# Patient Record
Sex: Female | Born: 1970 | Race: White | Hispanic: No | Marital: Married | Smoking: Former smoker
Health system: Southern US, Community
[De-identification: ages and names within clinical notes are randomized; demographics above are authoritative.]

## PROBLEM LIST (undated history)

## (undated) DIAGNOSIS — E669 Obesity, unspecified: Secondary | ICD-10-CM

## (undated) DIAGNOSIS — R739 Hyperglycemia, unspecified: Secondary | ICD-10-CM

## (undated) DIAGNOSIS — T8859XA Other complications of anesthesia, initial encounter: Secondary | ICD-10-CM

## (undated) DIAGNOSIS — T4145XA Adverse effect of unspecified anesthetic, initial encounter: Secondary | ICD-10-CM

## (undated) DIAGNOSIS — K649 Unspecified hemorrhoids: Secondary | ICD-10-CM

## (undated) DIAGNOSIS — E119 Type 2 diabetes mellitus without complications: Secondary | ICD-10-CM

## (undated) DIAGNOSIS — N951 Menopausal and female climacteric states: Secondary | ICD-10-CM

## (undated) DIAGNOSIS — C4490 Unspecified malignant neoplasm of skin, unspecified: Secondary | ICD-10-CM

## (undated) DIAGNOSIS — L0292 Furuncle, unspecified: Secondary | ICD-10-CM

## (undated) DIAGNOSIS — R232 Flushing: Secondary | ICD-10-CM

## (undated) DIAGNOSIS — F419 Anxiety disorder, unspecified: Secondary | ICD-10-CM

## (undated) DIAGNOSIS — E785 Hyperlipidemia, unspecified: Principal | ICD-10-CM

## (undated) HISTORY — PX: ANKLE FRACTURE SURGERY: SHX122

## (undated) HISTORY — PX: COLONOSCOPY: SHX174

## (undated) HISTORY — DX: Hyperglycemia, unspecified: R73.9

## (undated) HISTORY — DX: Furuncle, unspecified: L02.92

## (undated) HISTORY — DX: Flushing: R23.2

## (undated) HISTORY — DX: Unspecified hemorrhoids: K64.9

## (undated) HISTORY — DX: Unspecified malignant neoplasm of skin, unspecified: C44.90

## (undated) HISTORY — DX: Hyperlipidemia, unspecified: E78.5

## (undated) HISTORY — PX: OTHER SURGICAL HISTORY: SHX169

## (undated) HISTORY — DX: Menopausal and female climacteric states: N95.1

## (undated) HISTORY — DX: Anxiety disorder, unspecified: F41.9

## (undated) HISTORY — DX: Obesity, unspecified: E66.9

## (undated) HISTORY — DX: Type 2 diabetes mellitus without complications: E11.9

---

## 2008-05-17 ENCOUNTER — Other Ambulatory Visit: Admission: RE | Admit: 2008-05-17 | Discharge: 2008-05-17 | Payer: Self-pay | Admitting: Obstetrics and Gynecology

## 2009-08-13 ENCOUNTER — Other Ambulatory Visit: Admission: RE | Admit: 2009-08-13 | Discharge: 2009-08-13 | Payer: Self-pay | Admitting: Obstetrics and Gynecology

## 2010-10-13 HISTORY — PX: ENDOMETRIAL ABLATION: SHX621

## 2010-10-30 ENCOUNTER — Other Ambulatory Visit: Payer: Self-pay | Admitting: Adult Health

## 2010-10-30 ENCOUNTER — Other Ambulatory Visit
Admission: RE | Admit: 2010-10-30 | Discharge: 2010-10-30 | Payer: Self-pay | Source: Home / Self Care | Admitting: Obstetrics and Gynecology

## 2011-11-03 ENCOUNTER — Other Ambulatory Visit (HOSPITAL_COMMUNITY)
Admission: RE | Admit: 2011-11-03 | Discharge: 2011-11-03 | Disposition: A | Discharge status: Home / Self Care | Payer: BC Managed Care – PPO | Source: Ambulatory Visit | Attending: Obstetrics and Gynecology | Admitting: Obstetrics and Gynecology

## 2011-11-03 ENCOUNTER — Other Ambulatory Visit: Payer: Self-pay | Admitting: Adult Health

## 2011-11-03 DIAGNOSIS — Z01419 Encounter for gynecological examination (general) (routine) without abnormal findings: Secondary | ICD-10-CM | POA: Insufficient documentation

## 2012-03-08 ENCOUNTER — Encounter (HOSPITAL_COMMUNITY): Payer: Self-pay | Admitting: *Deleted

## 2012-03-08 ENCOUNTER — Emergency Department (HOSPITAL_COMMUNITY): Payer: BC Managed Care – PPO

## 2012-03-08 ENCOUNTER — Emergency Department (HOSPITAL_COMMUNITY)
Admission: EM | Admit: 2012-03-08 | Discharge: 2012-03-08 | Disposition: A | Discharge status: Home / Self Care | Payer: BC Managed Care – PPO | Attending: Emergency Medicine | Admitting: Emergency Medicine

## 2012-03-08 DIAGNOSIS — W010XXA Fall on same level from slipping, tripping and stumbling without subsequent striking against object, initial encounter: Secondary | ICD-10-CM | POA: Insufficient documentation

## 2012-03-08 DIAGNOSIS — S82842A Displaced bimalleolar fracture of left lower leg, initial encounter for closed fracture: Secondary | ICD-10-CM

## 2012-03-08 DIAGNOSIS — S82843A Displaced bimalleolar fracture of unspecified lower leg, initial encounter for closed fracture: Secondary | ICD-10-CM | POA: Insufficient documentation

## 2012-03-08 DIAGNOSIS — Y92009 Unspecified place in unspecified non-institutional (private) residence as the place of occurrence of the external cause: Secondary | ICD-10-CM | POA: Insufficient documentation

## 2012-03-08 MED ORDER — HYDROMORPHONE HCL PF 2 MG/ML IJ SOLN
2.0000 mg | Freq: Once | INTRAMUSCULAR | Status: AC
  Administered 2012-03-08: 2 mg via INTRAMUSCULAR
  Filled 2012-03-08: qty 1

## 2012-03-08 MED ORDER — OXYCODONE-ACETAMINOPHEN 5-325 MG PO TABS: 2.0000 | ORAL_TABLET | ORAL | Status: DC | PRN

## 2012-03-08 MED ORDER — ONDANSETRON HCL 4 MG/2ML IJ SOLN
4.0000 mg | Freq: Once | INTRAMUSCULAR | Status: AC
  Administered 2012-03-08: 4 mg via INTRAMUSCULAR
  Filled 2012-03-08: qty 2

## 2012-03-08 NOTE — ED Provider Notes (Signed)
History     CSN: [id number]  Arrival date & time 03/08/12  2037   First MD Initiated Contact with Patient 03/08/12 2126      Chief Complaint  Patient presents with  . Fall    (Consider location/radiation/quality/duration/timing/severity/associated sxs/prior treatment) HPI Comments: Pt was carrying a bag of trash outside.  She was wearing crocs and slipped on wet stairs and twisted L ankle.  No other significant injury.  The history is provided by the patient. No language interpreter was used.    History reviewed. No pertinent past medical history.  Past Surgical History  Procedure Date  . Cesarean section     No family history on file.  History  Substance Use Topics  . Smoking status: Current Everyday Smoker  . Smokeless tobacco: Not on file  . Alcohol Use: Yes    OB History    Grav Para Term Preterm Abortions TAB SAB Ect Mult Living                  Review of Systems  Musculoskeletal:       Ankle injury   Neurological:       Mild tingling in foot.  All other systems reviewed and are negative.    Allergies  Review of patient's allergies indicates no known allergies.  Home Medications   Current Outpatient Rx  Name Route Sig Dispense Refill  . VITAMIN D 1000 UNITS PO TABS Oral Take 1,000 Units by mouth daily.    Marland Kitchen CRANBERRY 450 MG PO TABS Oral Take 1 tablet by mouth daily.    . OMEGA-3 FATTY ACIDS 1000 MG PO CAPS Oral Take 1 g by mouth daily.    . OXYCODONE-ACETAMINOPHEN 5-325 MG PO TABS Oral Take 2 tablets by mouth every 4 (four) hours as needed for pain. 6 tablet 0    BP 122/76  Pulse 106  Temp(Src) 98 F (36.7 C) (Oral)  Resp 20  Ht 5\' 5"  (1.651 m)  Wt 195 lb (88.451 kg)  BMI 32.45 kg/m2  SpO2 99%  Physical Exam  Nursing note and vitals reviewed. Constitutional: She is oriented to person, place, and time. She appears well-developed and well-nourished. No distress.  HENT:  Head: Normocephalic and atraumatic.  Eyes: EOM are normal.    Neck: Normal range of motion.  Cardiovascular: Normal rate, regular rhythm, normal heart sounds and intact distal pulses.   Pulmonary/Chest: Effort normal and breath sounds normal.  Abdominal: Soft. She exhibits no distension. There is no tenderness.  Musculoskeletal: She exhibits tenderness.       Left ankle: She exhibits decreased range of motion, swelling and ecchymosis. She exhibits no deformity, no laceration and normal pulse. tenderness. Lateral malleolus and medial malleolus tenderness found. No proximal fibula tenderness found.       Feet:  Neurological: She is alert and oriented to person, place, and time.  Skin: Skin is warm and dry.  Psychiatric: She has a normal mood and affect. Judgment normal.    ED Course  Procedures (including critical care time)  Labs Reviewed - No data to display Dg Ankle Complete Left  03/08/2012  *RADIOLOGY REPORT*  Clinical Data: Pain after falling.  Popping of the ankle.  LEFT ANKLE COMPLETE - 3+ VIEW  Comparison: None.  Findings: There is significant soft tissue swelling of the ankle diffusely.  There is a displaced fracture of the medial malleolus, and a comminuted fracture the lateral malleolus.  It is difficult to exclude a posterior malleolar fracture. Suspect disruption  of the interosseous membrane.  IMPRESSION:  1.  Fractures of the medial and lateral malleoli. 2.  Question of posterior malleolus fracture. 3.  Suspect interosseous membrane disruption. 4.  soft tissue swelling.  Original Report Authenticated By: Patterson Hammersmith, M.D.     1. Closed bimalleolar fracture of left ankle       MDM  2100-discussed with dr. Romeo Apple.  Recommends posterior splint and crutches.  Non-weight bearing.  Pain medicine, ice and elevation.  Will see in his office tomorrow at 0900. rx-percocet 5/325, 20 ibprofen 800 mg every 8 hrs with food.        Worthy Rancher, PA 03/08/12 2220

## 2012-03-08 NOTE — ED Notes (Signed)
Fell on wet steps, app 6 steps, Injury to lt ankle, No LOC, alert, No HI or neck pain.

## 2012-03-08 NOTE — ED Notes (Signed)
Pt presents with left ankle pain secondary to a fall down 6 deck stairs at her home. Left ankle has noted deformity, swelling and bruising noted. Pt also hurt rt forearm,with noted bruising and a superficial abrasion.  Pt denies loc. Pt is unable to bear weight on lt foot. Dr Romeo Apple consulted per EDP.

## 2012-03-08 NOTE — Discharge Instructions (Signed)
Bimalleolar Fracture, Ankle, Adult, Displaced (ORIF) A bimalleolar fracture (break in bone) is two fractures in the lower bones of your leg that help to make up your ankle. These fractures are in the bone you feel as the bump on the outside of your ankle (fibula) and the bone that you feel as the bump on the inside of your ankle (tibia). Your fractures are displaced. This means the bones are not in their normal position and will not give a good result if they heal in that position. Because of this, surgery is required. This is called an open reduction and internal fixation (ORIF). Even with the best of care and perfect results this ankle may be more prone to be arthritis later due to damage of the cartilage lining the ankle joint which is not visible on X-ray. These fractures are easily diagnosed with X-rays. TREATMENT  You have fractures that would probably heal with disability, without surgery. Open reduction means that the area of the fracture is opened up to the vision of the surgeon and internal fixation means that a screw, pins or fixation device is used to hold the boney pieces in place. Following surgery a short-leg cast or removable fracture boot is then applied from your toes to below your knee. This is generally left in place for about 5 to 6 weeks, during which time it is followed by your caregiver and X-rays may be taken to make sure the bones stay in place. RISKS & COMPLICATIONS: All surgery is associated with risks. Some of these risks are:  Excessive bleeding   Infection   Failure to heal properly resulting in an unstable or arthritic ankle   Stiffness of ankle following repair  LET YOUR CAREGIVERS KNOW ABOUT:  Allergies.   Medications taken including herbs, eye drops, over-the-counter medications, and creams.   Use of steroids (by mouth or creams).   History of bleeding or blood problems.   Previous problems with anesthetics or numbing medication, including a family history  of these problems.   Possibility of pregnancy, if this applies.   History of blood clots (thrombophlebitis).   Previous surgery.   Other health problems.  BEFORE AND AFTER YOUR SURGERY Prior to surgery an IV (intravenous line connected to your vein for giving fluids) may be started and you will be given an anesthetic (medications and gas to make you sleep). You may also be given a regional anesthetic such as a spinal or epidural block. After surgery, you will be taken to the recovery area where a nurse will monitor your progress. You may have a catheter (a long, narrow, hollow tube) in your bladder following surgery that helps you pass your water. When you are awake, are stable, taking fluids well and without complications, you will be returned to your room. You will receive physical therapy and other care until you are doing well and your caregiver feels it is safe for you to be transferred either to home or to an extended care facility. HOME CARE INSTRUCTIONS   You may resume normal diet and activities as directed or allowed.   Do not drive a vehicle until your caregiver specifically tells you it is safe to do so.   Keep ice packs (a bag of ice wrapped in a towel) on the surgical area for 20 minutes, 4 times per day, for the first two days following surgery. Use the ice only if okay with your surgeon or caregiver.   Elevate your ankle above your heart as much  as possible for the first 24 to 48 hours after the operation.   Change dressings if necessary or as directed.   If you have a plaster or fiberglass cast:   Do not try to scratch the skin under the cast using sharp or pointed objects.   Check the skin around the cast every day. You may put lotion on any red or sore areas.   Keep your cast dry and clean.   Do not put pressure on any part of your cast or splint until it is fully hardened.   Your cast or splint can be protected during bathing with a plastic bag. Do not lower the  cast or splint into water.   Only take over-the-counter or prescription medicines for pain, discomfort, or fever as directed by your caregiver.   Use crutches as directed and do not exercise leg unless instructed.   These are not fractures to be taken lightly! If these bones become displaced and get out of position, it may eventually lead to arthritis and disability for the rest of your life. Problems often follow even the best of care. Follow the directions of your caregiver.   Keep appointments as directed.  SEEK IMMEDIATE MEDICAL CARE IF:   Redness, swelling, numbness, or increasing pain in the wound.   Pus coming from wound.   An unexplained oral temperature above 102 F (38.9 C) or as your caregiver suggests.   A bad smell coming from the wound or dressing.   A breaking open of the wound (edges not staying together) after sutures or staples have been removed.   Your skin or nails below the injury turn blue or gray, or feel cold or numb.   You develop severe pain under the cast or in your foot. Especially when someone else moves your toes.  Follow all instructions given to you by your caregiver, make and keep follow up appointments, and use crutches as directed. If you do not have a window in your cast for observing the wound, a discharge, or minor bleeding may show up as a stain on the outside of your dressings, your cast, or plaster splint. Report these findings to your caregiver. MAKE SURE YOU:   Understand these instructions.   Will watch your condition.   Will get help right away if you are not doing well or get worse.  Document Released: 07/09/2005 Document Revised: 09/18/2011 Document Reviewed: 05/03/2008 [facility] Patient Information 2012 [place], [place].   Take the percocet every 4-6 hrs as needed for pain.  Apply ice 20-30 min several times daily and elevate as much as possible.  Dr. Romeo Apple will see you in his office tomorrow at 0900.  Use the crutches and do not  bear weight.  Have dr. Romeo Apple write you a prescription for the pain medicine of his choice.

## 2012-03-08 NOTE — ED Provider Notes (Signed)
Medical screening examination/treatment/procedure(s) were performed by non-physician practitioner and as supervising physician I was immediately available for consultation/collaboration. Devoria Albe, MD, Armando Gang   Ward Givens, MD 03/08/12 2251

## 2012-03-09 ENCOUNTER — Encounter: Payer: Self-pay | Admitting: Orthopedic Surgery

## 2012-03-09 ENCOUNTER — Encounter (HOSPITAL_COMMUNITY)
Admission: RE | Admit: 2012-03-09 | Discharge: 2012-03-09 | Disposition: A | Discharge status: Home / Self Care | Payer: BC Managed Care – PPO | Source: Ambulatory Visit | Attending: Orthopedic Surgery | Admitting: Orthopedic Surgery

## 2012-03-09 ENCOUNTER — Encounter (HOSPITAL_COMMUNITY): Payer: Self-pay | Admitting: Pharmacy Technician

## 2012-03-09 ENCOUNTER — Ambulatory Visit (INDEPENDENT_AMBULATORY_CARE_PROVIDER_SITE_OTHER): Payer: BC Managed Care – PPO | Admitting: Orthopedic Surgery

## 2012-03-09 ENCOUNTER — Encounter (HOSPITAL_COMMUNITY): Payer: Self-pay

## 2012-03-09 VITALS — BP 100/60 | Ht 65.0 in | Wt 195.0 lb

## 2012-03-09 DIAGNOSIS — S82843A Displaced bimalleolar fracture of unspecified lower leg, initial encounter for closed fracture: Secondary | ICD-10-CM

## 2012-03-09 LAB — HEMOGLOBIN AND HEMATOCRIT, BLOOD: Hemoglobin: 13.9 g/dL (ref 12.0–15.0)

## 2012-03-09 LAB — SURGICAL PCR SCREEN: Staphylococcus aureus: NEGATIVE

## 2012-03-09 LAB — HCG, SERUM, QUALITATIVE: Preg, Serum: NEGATIVE

## 2012-03-09 MED ORDER — OXYCODONE-ACETAMINOPHEN 5-325 MG PO TABS: 1.0000 | ORAL_TABLET | ORAL | Status: AC | PRN

## 2012-03-09 MED ORDER — PROMETHAZINE HCL 12.5 MG PO TABS
12.5000 mg | ORAL_TABLET | Freq: Four times a day (QID) | ORAL | Status: DC | PRN
Start: 2012-03-09 — End: 2012-03-09

## 2012-03-09 NOTE — Patient Instructions (Addendum)
PATIENT INSTRUCTIONS POST-ANESTHESIA  IMMEDIATELY FOLLOWING SURGERY:  Do not drive or operate machinery for the first twenty four hours after surgery.  Do not make any important decisions for twenty four hours after surgery or while taking narcotic pain medications or sedatives.  If you develop intractable nausea and vomiting or a severe headache please notify your doctor immediately.  FOLLOW-UP:  Please make an appointment with your surgeon as instructed. You do not need to follow up with anesthesia unless specifically instructed to do so.  WOUND CARE INSTRUCTIONS (if applicable):  Keep a dry clean dressing on the anesthesia/puncture wound site if there is drainage.  Once the wound has quit draining you may leave it open to air.  Generally you should leave the bandage intact for twenty four hours unless there is drainage.  If the epidural site drains for more than 36-48 hours please call the anesthesia department.  QUESTIONS?:  Please feel free to call your physician or the hospital operator if you have any questions, and they will be happy to assist you.    20 Margaret [name]  03/09/2012   Your procedure is scheduled on:  03/11/2012  Report to [facility] at  1130  AM.  Call this number if you have problems the morning of surgery: [phone]   Remember:   Do not eat food:After Midnight.  May have clear liquids:until Midnight .    Take these medicines the morning of surgery with A SIP OF WATER:  Percocet,phenergan   Do not wear jewelry, make-up or nail polish.  Do not wear lotions, powders, or perfumes. You may wear deodorant.  Do not shave 48 hours prior to surgery. Men may shave face and neck.  Do not bring valuables to the hospital.  Contacts, dentures or bridgework may not be worn into surgery.  Leave suitcase in the car. After surgery it may be brought to your room.  For patients admitted to the hospital, checkout time is 11:00 AM the day of discharge.   Patients discharged the day of  surgery will not be allowed to drive home.  Name and phone number of your driver: family  Special Instructions: CHG Shower Use Special Wash: 1/2 bottle night before surgery and 1/2 bottle morning of surgery.   Please read over the following fact sheets that you were given: Pain Booklet, MRSA Information, Surgical Site Infection Prevention, Anesthesia Post-op Instructions and Care and Recovery After Surgery Ankle Fracture A fracture is a break in the bone. A cast or splint is used to protect and keep your injured bone from moving.  HOME CARE INSTRUCTIONS   Use your crutches as directed.   To lessen the swelling, keep the injured leg elevated while sitting or lying down.   Apply ice to the injury for 15 to 20 minutes, 3 to 4 times per day while awake for 2 days. Put the ice in a plastic bag and place a thin towel between the bag of ice and your cast.   If you have a plaster or fiberglass cast:   Do not try to scratch the skin under the cast using sharp or pointed objects.   Check the skin around the cast every day. You may put lotion on any red or sore areas.   Keep your cast dry and clean.   If you have a plaster splint:   Wear the splint as directed.   You may loosen the elastic around the splint if your toes become numb, tingle, or turn  cold or blue.   Do not put pressure on any part of your cast or splint; it may break. Rest your cast only on a pillow the first 24 hours until it is fully hardened.   Your cast or splint can be protected during bathing with a plastic bag. Do not lower the cast or splint into water.   Take medications as directed by your caregiver. Only take over-the-counter or prescription medicines for pain, discomfort, or fever as directed by your caregiver.   Do not drive a vehicle until your caregiver specifically tells you it is safe to do so.   If your caregiver has given you a follow-up appointment, it is very important to keep that appointment. Not keeping  the appointment could result in a chronic or permanent injury, pain, and disability. If there is any problem keeping the appointment, you must call back to this facility for assistance.  SEEK IMMEDIATE MEDICAL CARE IF:   Your cast gets damaged or breaks.   You have continued severe pain or more swelling than you did before the cast was put on.   Your skin or toenails below the injury turn blue or gray, or feel cold or numb.   There is a bad smell or new stains and/or purulent (pus like) drainage coming from under the cast.  If you do not have a window in your cast for observing the wound, a discharge or minor bleeding may show up as a stain on the outside of your cast. Report these findings to your caregiver. MAKE SURE YOU:   Understand these instructions.   Will watch your condition.   Will get help right away if you are not doing well or get worse.  Document Released: 09/26/2000 Document Revised: 09/18/2011 Document Reviewed: 05/02/2008 [facility] Patient Information 2012 [place], [place].

## 2012-03-09 NOTE — Patient Instructions (Addendum)
ANKLE SURGERY  You have been scheduled for surgery.  All surgeries carry some risk.  Remember you always have the option of continued nonsurgical treatment. However in this situation the risks vs. the benefits favor surgery as the best treatment option. The risks of the surgery includes the following but is not limited to bleeding, infection, pulmonary embolus, death from anesthesia, nerve injury vascular injury or need for further surgery, continued pain.  Specific to this procedure the following risks and complications are rare but possible Stiffness, pain, INFECTION   I expect 3-6 months to fully recover from this procedure.    Bimalleolar Fracture, Ankle, Adult, Displaced (ORIF) A bimalleolar fracture (break in bone) is two fractures in the lower bones of your leg that help to make up your ankle. These fractures are in the bone you feel as the bump on the outside of your ankle (fibula) and the bone that you feel as the bump on the inside of your ankle (tibia). Your fractures are displaced. This means the bones are not in their normal position and will not give a good result if they heal in that position. Because of this, surgery is required. This is called an open reduction and internal fixation (ORIF). Even with the best of care and perfect results this ankle may be more prone to be arthritis later due to damage of the cartilage lining the ankle joint which is not visible on X-ray. These fractures are easily diagnosed with X-rays. TREATMENT   You have fractures that would probably heal with disability, without surgery. Open reduction means that the area of the fracture is opened up to the vision of the surgeon and internal fixation means that a screw, pins or fixation device is used to hold the boney pieces in place. Following surgery a short-leg cast or removable fracture boot is then applied from your toes to below your knee. This is generally left in place for about 5 to 6 weeks, during which  time it is followed by your caregiver and X-rays may be taken to make sure the bones stay in place. RISKS & COMPLICATIONS: All surgery is associated with risks. Some of these risks are:  Excessive bleeding   Infection   Failure to heal properly resulting in an unstable or arthritic ankle   Stiffness of ankle following repair  LET YOUR CAREGIVERS KNOW ABOUT:  Allergies.   Medications taken including herbs, eye drops, over-the-counter medications, and creams.   Use of steroids (by mouth or creams).   History of bleeding or blood problems.   Previous problems with anesthetics or numbing medication, including a family history of these problems.   Possibility of pregnancy, if this applies.   History of blood clots (thrombophlebitis).   Previous surgery.   Other health problems.  BEFORE AND AFTER YOUR SURGERY Prior to surgery an IV (intravenous line connected to your vein for giving fluids) may be started and you will be given an anesthetic (medications and gas to make you sleep). You may also be given a regional anesthetic such as a spinal or epidural block. After surgery, you will be taken to the recovery area where a nurse will monitor your progress. You may have a catheter (a long, narrow, hollow tube) in your bladder following surgery that helps you pass your water. When you are awake, are stable, taking fluids well and without complications, you will be returned to your room. You will receive physical therapy and other care until you are doing well and your  caregiver feels it is safe for you to be transferred either to home or to an extended care facility. HOME CARE INSTRUCTIONS    You may resume normal diet and activities as directed or allowed.   Do not drive a vehicle until your caregiver specifically tells you it is safe to do so.   Keep ice packs (a bag of ice wrapped in a towel) on the surgical area for 20 minutes, 4 times per day, for the first two days following surgery.  Use the ice only if okay with your surgeon or caregiver.   Elevate your ankle above your heart as much as possible for the first 24 to 48 hours after the operation.   Change dressings if necessary or as directed.   If you have a plaster or fiberglass cast:   Do not try to scratch the skin under the cast using sharp or pointed objects.   Check the skin around the cast every day. You may put lotion on any red or sore areas.   Keep your cast dry and clean.   Do not put pressure on any part of your cast or splint until it is fully hardened.   Your cast or splint can be protected during bathing with a plastic bag. Do not lower the cast or splint into water.   Only take over-the-counter or prescription medicines for pain, discomfort, or fever as directed by your caregiver.   Use crutches as directed and do not exercise leg unless instructed.   These are not fractures to be taken lightly! If these bones become displaced and get out of position, it may eventually lead to arthritis and disability for the rest of your life. Problems often follow even the best of care. Follow the directions of your caregiver.   Keep appointments as directed.  SEEK IMMEDIATE MEDICAL CARE IF:    Redness, swelling, numbness, or increasing pain in the wound.   Pus coming from wound.   An unexplained oral temperature above 102 F (38.9 C) or as your caregiver suggests.   A bad smell coming from the wound or dressing.   A breaking open of the wound (edges not staying together) after sutures or staples have been removed.   Your skin or nails below the injury turn blue or gray, or feel cold or numb.   You develop severe pain under the cast or in your foot. Especially when someone else moves your toes.  Follow all instructions given to you by your caregiver, make and keep follow up appointments, and use crutches as directed. If you do not have a window in your cast for observing the wound, a discharge, or minor  bleeding may show up as a stain on the outside of your dressings, your cast, or plaster splint. Report these findings to your caregiver. MAKE SURE YOU:    Understand these instructions.   Will watch your condition.   Will get help right away if you are not doing well or get worse.  Document Released: 07/09/2005 Document Revised: 09/18/2011 Document Reviewed: 05/03/2008 [facility] Patient Information 2012 [place], [place].  OOW NOTE IF 6 WEEKS

## 2012-03-09 NOTE — Progress Notes (Signed)
  Subjective:    Margaret [name] is a 41 y.o. female who presents with left ankle pain. Onset of the symptoms was yesterday. Inciting event: injured while falling. Current symptoms include: bruising, inability to bear weight, pain with inversion of the foot, stiffness and swelling. Aggravating factors: direct pressure, standing, walking  and weight bearing. Symptoms have been intermittent. Patient has had no prior ankle problems. Evaluation to date: plain films: abnormal Bimalleolar left ankle fracture. Treatment to date: the patient had a splint applied, was given crutches and pain medication. A complete history and physical was performed   The review of systems is positive for nausea from the medicines and otherwise is normal    Allergies none  Cesarean section and uterine ablation  No major medical problems  [place] trauma  Dr. 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 Her highest educational level was Ed.S   Objective:  Vital signs are stable as recorded BP 100/60  Ht 5\' 5"  (1.651 m)  Wt 195 lb (88.451 kg)  BMI 32.45 kg/m2  General appearance is normal  The patient is alert and oriented x3  The patient's mood and affect are normal  Gait assessment: can not without crutches  The cardiovascular exam reveals normal pulses and temperature without edema swelling.  The lymphatic system is negative for palpable lymph nodes  The sensory exam is normal.  There are no pathologic reflexes.  Balance is normal.  Upper extremity exam  Inspection and palpation revealed no abnormalities in the upper extremities, except contusion right arm .  Range of motion is full without contracture.  Motor exam is normal with grade 5 strength.  The joints are fully reduced without  subluxation.  There is no atrophy or tremor and muscle tone is normal.  All joints are stable.  right Lower extremity exam  Inspection and palpation revealed no tenderness or abnormality in alignment in the lower extremities. Range of motion is full.  Strength is grade 5.  The left lower exam is notable for significant swelling in the medial lateral ankle joint with tenderness over the mediolateral malleolus. There is no major deformity. Range of motion is limited secondary to pain as is strength. Stability cannot be tested. Skin was intact without blistering.  Assessment:    Fracture of left ankle bimalleolar  The fracture has an unstable pattern. There is no syndesmosis injury on x-ray. I reviewed the x-rays with the patient as well as postoperative treatment plan. I explained her that these fractures are usually unstable and will not easily be held in a cast. I offered her possibility of cast and then serial x-rays. She agreed with me that this was not the best way to go.     Plan:    Splint applied. OTIF

## 2012-03-10 ENCOUNTER — Telehealth: Payer: Self-pay | Admitting: Orthopedic Surgery

## 2012-03-10 NOTE — Telephone Encounter (Signed)
Contacted [employer] plan at ph# [phone] regarding out-patient surgery scheduled 03/11/12 at [facility].  CPT [phone], ICD-9 code 824.4. Per Darcella Gasman, no pre-authorization is required for out-patient surgery.  No authorization or reference #, her name and today's date under patient's ID#.

## 2012-03-11 ENCOUNTER — Ambulatory Visit (HOSPITAL_COMMUNITY)
Admission: RE | Admit: 2012-03-11 | Discharge: 2012-03-11 | Disposition: A | Discharge status: Home / Self Care | Payer: BC Managed Care – PPO | Source: Ambulatory Visit | Attending: Orthopedic Surgery | Admitting: Orthopedic Surgery

## 2012-03-11 ENCOUNTER — Encounter (HOSPITAL_COMMUNITY): Payer: Self-pay | Admitting: *Deleted

## 2012-03-11 ENCOUNTER — Ambulatory Visit (HOSPITAL_COMMUNITY): Payer: BC Managed Care – PPO

## 2012-03-11 ENCOUNTER — Encounter (HOSPITAL_COMMUNITY)
Admission: RE | Disposition: A | Discharge status: Home / Self Care | Payer: Self-pay | Source: Ambulatory Visit | Attending: Orthopedic Surgery

## 2012-03-11 ENCOUNTER — Encounter (HOSPITAL_COMMUNITY): Payer: Self-pay | Admitting: Anesthesiology

## 2012-03-11 ENCOUNTER — Ambulatory Visit (HOSPITAL_COMMUNITY): Payer: BC Managed Care – PPO | Admitting: Anesthesiology

## 2012-03-11 DIAGNOSIS — Z01812 Encounter for preprocedural laboratory examination: Secondary | ICD-10-CM | POA: Insufficient documentation

## 2012-03-11 DIAGNOSIS — S82843A Displaced bimalleolar fracture of unspecified lower leg, initial encounter for closed fracture: Secondary | ICD-10-CM

## 2012-03-11 DIAGNOSIS — W19XXXA Unspecified fall, initial encounter: Secondary | ICD-10-CM | POA: Insufficient documentation

## 2012-03-11 HISTORY — DX: Adverse effect of unspecified anesthetic, initial encounter: T41.45XA

## 2012-03-11 HISTORY — DX: Other complications of anesthesia, initial encounter: T88.59XA

## 2012-03-11 HISTORY — PX: ORIF ANKLE FRACTURE: SHX5408

## 2012-03-11 SURGERY — OPEN REDUCTION INTERNAL FIXATION (ORIF) ANKLE FRACTURE
Anesthesia: General | Site: Ankle | Laterality: Left | Wound class: Clean

## 2012-03-11 MED ORDER — ONDANSETRON HCL 4 MG/2ML IJ SOLN
4.0000 mg | Freq: Once | INTRAMUSCULAR | Status: DC | PRN
Start: 2012-03-11 — End: 2012-03-11

## 2012-03-11 MED ORDER — MIDAZOLAM HCL 2 MG/2ML IJ SOLN
INTRAMUSCULAR | Status: AC
  Filled 2012-03-11: qty 2

## 2012-03-11 MED ORDER — OXYCODONE-ACETAMINOPHEN 5-325 MG PO TABS: 1.0000 | ORAL_TABLET | ORAL | Status: AC | PRN

## 2012-03-11 MED ORDER — CELECOXIB 100 MG PO CAPS
400.0000 mg | ORAL_CAPSULE | Freq: Once | ORAL | Status: AC
  Administered 2012-03-11: 400 mg via ORAL

## 2012-03-11 MED ORDER — ACETAMINOPHEN 10 MG/ML IV SOLN
1000.0000 mg | Freq: Once | INTRAVENOUS | Status: AC
  Administered 2012-03-11: 1000 mg via INTRAVENOUS

## 2012-03-11 MED ORDER — PROPOFOL 10 MG/ML IV EMUL
INTRAVENOUS | Status: AC
  Filled 2012-03-11: qty 20

## 2012-03-11 MED ORDER — FENTANYL CITRATE 0.05 MG/ML IJ SOLN
INTRAMUSCULAR | Status: DC | PRN
  Administered 2012-03-11: 25 ug via INTRAVENOUS
  Administered 2012-03-11: 50 ug via INTRAVENOUS
  Administered 2012-03-11 (×4): 25 ug via INTRAVENOUS
  Administered 2012-03-11: 50 ug via INTRAVENOUS
  Administered 2012-03-11 (×7): 25 ug via INTRAVENOUS

## 2012-03-11 MED ORDER — FENTANYL CITRATE 0.05 MG/ML IJ SOLN
INTRAMUSCULAR | Status: AC
  Administered 2012-03-11: 50 ug via INTRAVENOUS
  Filled 2012-03-11: qty 2

## 2012-03-11 MED ORDER — BUPIVACAINE-EPINEPHRINE PF 0.5-1:200000 % IJ SOLN
INTRAMUSCULAR | Status: AC
  Filled 2012-03-11: qty 20

## 2012-03-11 MED ORDER — CHLORHEXIDINE GLUCONATE 4 % EX LIQD
60.0000 mL | Freq: Once | CUTANEOUS | Status: DC
  Filled 2012-03-11: qty 60

## 2012-03-11 MED ORDER — LACTATED RINGERS IV SOLN
INTRAVENOUS | Status: DC
  Administered 2012-03-11: 1000 mL via INTRAVENOUS
  Administered 2012-03-11: 15:00:00 via INTRAVENOUS

## 2012-03-11 MED ORDER — ONDANSETRON HCL 4 MG/2ML IJ SOLN
INTRAMUSCULAR | Status: AC
  Filled 2012-03-11: qty 2

## 2012-03-11 MED ORDER — FENTANYL CITRATE 0.05 MG/ML IJ SOLN
25.0000 ug | INTRAMUSCULAR | Status: DC | PRN
  Administered 2012-03-11 (×4): 50 ug via INTRAVENOUS

## 2012-03-11 MED ORDER — LIDOCAINE HCL (PF) 1 % IJ SOLN
INTRAMUSCULAR | Status: AC
  Filled 2012-03-11: qty 5

## 2012-03-11 MED ORDER — CELECOXIB 100 MG PO CAPS
ORAL_CAPSULE | ORAL | Status: AC
  Administered 2012-03-11: 400 mg via ORAL
  Filled 2012-03-11: qty 4

## 2012-03-11 MED ORDER — CEFAZOLIN SODIUM-DEXTROSE 2-3 GM-% IV SOLR
INTRAVENOUS | Status: AC
  Filled 2012-03-11: qty 50

## 2012-03-11 MED ORDER — ACETAMINOPHEN 10 MG/ML IV SOLN
INTRAVENOUS | Status: AC
  Administered 2012-03-11: 1000 mg via INTRAVENOUS
  Filled 2012-03-11: qty 100

## 2012-03-11 MED ORDER — ONDANSETRON HCL 4 MG/2ML IJ SOLN
4.0000 mg | Freq: Once | INTRAMUSCULAR | Status: AC
  Administered 2012-03-11: 4 mg via INTRAVENOUS

## 2012-03-11 MED ORDER — PROMETHAZINE HCL 12.5 MG PO TABS: 12.5000 mg | ORAL_TABLET | Freq: Four times a day (QID) | ORAL | Status: AC | PRN

## 2012-03-11 MED ORDER — CEFAZOLIN SODIUM-DEXTROSE 2-3 GM-% IV SOLR
2.0000 g | INTRAVENOUS | Status: AC
  Administered 2012-03-11: 2 g via INTRAVENOUS

## 2012-03-11 MED ORDER — FENTANYL CITRATE 0.05 MG/ML IJ SOLN
INTRAMUSCULAR | Status: AC
  Filled 2012-03-11: qty 2

## 2012-03-11 MED ORDER — OXYCODONE HCL 5 MG PO TABS
5.0000 mg | ORAL_TABLET | ORAL | Status: DC
  Administered 2012-03-11: 5 mg via ORAL

## 2012-03-11 MED ORDER — LIDOCAINE HCL (CARDIAC) 10 MG/ML IV SOLN
INTRAVENOUS | Status: DC | PRN
  Administered 2012-03-11: 10 mg via INTRAVENOUS

## 2012-03-11 MED ORDER — PROPOFOL 10 MG/ML IV EMUL
INTRAVENOUS | Status: DC | PRN
  Administered 2012-03-11: 10 mg via INTRAVENOUS
  Administered 2012-03-11: 20 mg via INTRAVENOUS
  Administered 2012-03-11: 150 mg via INTRAVENOUS

## 2012-03-11 MED ORDER — BUPIVACAINE-EPINEPHRINE PF 0.5-1:200000 % IJ SOLN
INTRAMUSCULAR | Status: DC | PRN
  Administered 2012-03-11: 60 mL

## 2012-03-11 MED ORDER — MIDAZOLAM HCL 2 MG/2ML IJ SOLN
1.0000 mg | INTRAMUSCULAR | Status: DC | PRN
  Administered 2012-03-11: 2 mg via INTRAVENOUS

## 2012-03-11 MED ORDER — SODIUM CHLORIDE 0.9 % IR SOLN
Status: DC | PRN
  Administered 2012-03-11 (×2): 1000 mL

## 2012-03-11 MED ORDER — ONDANSETRON HCL 4 MG/2ML IJ SOLN
INTRAMUSCULAR | Status: AC
  Administered 2012-03-11: 4 mg via INTRAVENOUS
  Filled 2012-03-11: qty 2

## 2012-03-11 MED ORDER — OXYCODONE HCL 5 MG PO TABS
ORAL_TABLET | ORAL | Status: AC
  Administered 2012-03-11: 5 mg via ORAL
  Filled 2012-03-11: qty 1

## 2012-03-11 SURGICAL SUPPLY — 70 items
1.25mm x 150mm K-wire ×2 IMPLANT
BAG HAMPER (MISCELLANEOUS) ×2 IMPLANT
BANDAGE ELASTIC 3 VELCRO NS (GAUZE/BANDAGES/DRESSINGS) ×1 IMPLANT
BANDAGE ELASTIC 4 VELCRO NS (GAUZE/BANDAGES/DRESSINGS) ×2 IMPLANT
BANDAGE ELASTIC 6 VELCRO NS (GAUZE/BANDAGES/DRESSINGS) ×4 IMPLANT
BANDAGE ESMARK 4X12 BL STRL LF (DISPOSABLE) ×1 IMPLANT
BIT DRILL 2.5X110 QC LCP DISP (BIT) ×2 IMPLANT
BIT DRILL 2.8 (BIT) ×1
BIT DRILL CANN QC 2.8X165 (BIT) ×1 IMPLANT
BIT DRILL QC 3.5X110 (BIT) ×1 IMPLANT
BLADE SURG SZ10 CARB STEEL (BLADE) ×2 IMPLANT
BNDG CMPR 12X4 ELC STRL LF (DISPOSABLE) ×1
BNDG COHESIVE 4X5 TAN STRL (GAUZE/BANDAGES/DRESSINGS) ×2 IMPLANT
BNDG ESMARK 4X12 BLUE STRL LF (DISPOSABLE) ×2
CHLORAPREP W/TINT 26ML (MISCELLANEOUS) ×2 IMPLANT
CLOTH BEACON ORANGE TIMEOUT ST (SAFETY) ×2 IMPLANT
COVER LIGHT HANDLE STERIS (MISCELLANEOUS) ×4 IMPLANT
CUFF TOURNIQUET SINGLE 34IN LL (TOURNIQUET CUFF) ×2 IMPLANT
DRAPE C-ARM FOLDED MOBILE STRL (DRAPES) ×2 IMPLANT
DRAPE PROXIMA HALF (DRAPES) ×2 IMPLANT
DRILL BIT 2.8MM (BIT) ×2
GAUZE XEROFORM 5X9 LF (GAUZE/BANDAGES/DRESSINGS) ×2 IMPLANT
GLOVE ECLIPSE 7.0 STRL STRAW (GLOVE) ×1 IMPLANT
GLOVE EXAM NITRILE MD LF STRL (GLOVE) ×1 IMPLANT
GLOVE INDICATOR 7.5 STRL GRN (GLOVE) ×1 IMPLANT
GLOVE SKINSENSE NS SZ8.0 LF (GLOVE) ×1
GLOVE SKINSENSE STRL SZ8.0 LF (GLOVE) ×1 IMPLANT
GLOVE SS N UNI LF 8.5 STRL (GLOVE) ×2 IMPLANT
GOWN STRL REIN XL XLG (GOWN DISPOSABLE) ×6 IMPLANT
INST SET MINOR BONE (KITS) ×2 IMPLANT
K-WIRE 1.25 TRCR POINT 150 (WIRE)
K-WIRE 1.6X150 (WIRE)
K-WIRE 2.0X150M (WIRE)
KIT ROOM TURNOVER APOR (KITS) ×2 IMPLANT
KWIRE 1.25 TRCR POINT 150 (WIRE) IMPLANT
KWIRE 1.6X150 (WIRE) IMPLANT
KWIRE 2.0X150M (WIRE) IMPLANT
MANIFOLD NEPTUNE II (INSTRUMENTS) ×2 IMPLANT
NDL HYPO 21X1.5 SAFETY (NEEDLE) ×1 IMPLANT
NEEDLE HYPO 21X1.5 SAFETY (NEEDLE) ×2 IMPLANT
NS IRRIG 1000ML POUR BTL (IV SOLUTION) ×2 IMPLANT
PACK BASIC LIMB (CUSTOM PROCEDURE TRAY) ×2 IMPLANT
PAD ABD 5X9 TENDERSORB (GAUZE/BANDAGES/DRESSINGS) ×4 IMPLANT
PAD ARMBOARD 7.5X6 YLW CONV (MISCELLANEOUS) ×2 IMPLANT
PAD CAST 4YDX4 CTTN HI CHSV (CAST SUPPLIES) ×1 IMPLANT
PADDING CAST COTTON 4X4 STRL (CAST SUPPLIES) ×2
PLATE LCP 3.5 1/3 TUB 7HX81 (Plate) ×1 IMPLANT
SCREW CANC FT 4.0X20 (Screw) ×1 IMPLANT
SCREW CANC FT ST SFS 4X14 (Screw) ×1 IMPLANT
SCREW CORTEX 3.5 12MM (Screw) ×2 IMPLANT
SCREW CORTEX 3.5 14MM (Screw) ×1 IMPLANT
SCREW CORTEX 3.5 16MM (Screw) ×1 IMPLANT
SCREW CORTEX 3.5 22MM (Screw) ×1 IMPLANT
SCREW LOCK CORT ST 3.5X12 (Screw) IMPLANT
SCREW LOCK CORT ST 3.5X14 (Screw) IMPLANT
SCREW LOCK CORT ST 3.5X16 (Screw) IMPLANT
SCREW LOCK CORT ST 3.5X22 (Screw) IMPLANT
SET BASIN LINEN APH (SET/KITS/TRAYS/PACK) ×2 IMPLANT
SPLINT IMMOBILIZER J 3INX20FT (CAST SUPPLIES)
SPLINT J IMMOBILIZER 3X20FT (CAST SUPPLIES) IMPLANT
SPLINT J IMMOBILIZER 4X20FT (CAST SUPPLIES) ×1 IMPLANT
SPLINT J PLASTER J 4INX20Y (CAST SUPPLIES) ×1
SPONGE GAUZE 4X4 12PLY (GAUZE/BANDAGES/DRESSINGS) ×2 IMPLANT
SPONGE LAP 18X18 X RAY DECT (DISPOSABLE) ×2 IMPLANT
STAPLER VISISTAT 35W (STAPLE) ×2 IMPLANT
SUT ETHILON 3 0 FSL (SUTURE) IMPLANT
SUT MON AB 0 CT1 (SUTURE) ×2 IMPLANT
SUT MON AB 2-0 CT1 36 (SUTURE) IMPLANT
SYR 30ML LL (SYRINGE) ×2 IMPLANT
SYR BULB IRRIGATION 50ML (SYRINGE) ×2 IMPLANT

## 2012-03-11 NOTE — H&P (Signed)
  Margaret [name] is a 41 y.o. female who presents with left ankle pain. Onset of the symptoms was yesterday. Inciting event: injured while falling. Current symptoms include: bruising, inability to bear weight, pain with inversion of the foot, stiffness and swelling. Aggravating factors: direct pressure, standing, walking and weight bearing. Symptoms have been intermittent. Patient has had no prior ankle problems. Evaluation to date: plain films: abnormal Bimalleolar left ankle fracture. Treatment to date: the patient had a splint applied, was given crutches and pain medication.  A complete history and physical was performed  The review of systems is positive for nausea from the medicines and otherwise is normal  Allergies none  Cesarean section and uterine ablation  No major medical problems  [place] trauma  Dr. 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 Her highest educational level was Ed.S   Objective:   Vital signs are stable as recorded  BP 100/60  Ht 5\' 5"  (1.651 m)  Wt 195 lb (88.451 kg)  BMI 32.45 kg/m2  General appearance is normal  The patient is alert and oriented x3  The patient's mood and affect are normal  Gait assessment: can not without crutches  The cardiovascular exam reveals normal pulses and temperature without edema swelling.  The lymphatic system is negative for palpable lymph nodes  The sensory exam is normal.  There are no pathologic reflexes.  Balance is normal.  Upper extremity exam  Inspection and palpation revealed no abnormalities in the upper extremities, except contusion right arm . Range of motion is full without contracture.  Motor exam is normal with grade 5 strength.  The joints are fully reduced without subluxation.  There is no atrophy or tremor and  muscle tone is normal. All joints are stable.  right Lower extremity exam  Inspection and palpation revealed no tenderness or abnormality in alignment in the lower extremities.  Range of motion is full. Strength is grade 5.  The left lower exam is notable for significant swelling in the medial lateral ankle joint with tenderness over the mediolateral malleolus. There is no major deformity. Range of motion is limited secondary to pain as is strength. Stability cannot be tested. Skin was intact without blistering.   Assessment:   Fracture of left ankle bimalleolar  The fracture has an unstable pattern. There is no syndesmosis injury on x-ray. I reviewed the x-rays with the patient as well as postoperative treatment plan. I explained her that these fractures are usually unstable and will not easily be held in a cast. I offered her possibility of cast and then serial x-rays. She agreed with me that this was not the best way to go.   Plan:   Splint applied.  OTIF

## 2012-03-11 NOTE — Transfer of Care (Signed)
Immediate Anesthesia Transfer of Care Note  Patient: Margaret [name]  Procedure(s) Performed: Procedure(s) (LRB): OPEN REDUCTION INTERNAL FIXATION (ORIF) ANKLE FRACTURE (Left)  Patient Location: PACU  Anesthesia Type: General  Level of Consciousness: awake  Airway & Oxygen Therapy: Patient Spontanous Breathing and non-rebreather face mask  Post-op Assessment: Report given to PACU RN, Post -op Vital signs reviewed and stable and Patient moving all extremities  Post vital signs: Reviewed and stable  Complications: No apparent anesthesia complications

## 2012-03-11 NOTE — Brief Op Note (Addendum)
03/11/2012  2:57 PM  PATIENT:  Alexxa B Lanz  41 y.o. female  PRE-OPERATIVE DIAGNOSIS:  Bimalleolar left ankle fracture  POST-OPERATIVE DIAGNOSIS:  Bimalleolar left ankle fracture  PROCEDURE:  Procedure(s) (LRB): OPEN REDUCTION INTERNAL FIXATION (ORIF) ANKLE FRACTURE (Left)  SYNTHES PLATE 7 HOLES WITH 6 SCREWS + 1 INTERFRAG SCREW  2 MEDIAL k WIRES ( 1.2)   SURGEON:  Surgeon(s) and Role:    * Vickki Hearing, MD - Primary  PHYSICIAN ASSISTANT:   ASSISTANTS: none   ANESTHESIA:   general  EBL:  Total I/O In: 1000 [I.V.:1000] Out: 15 [Blood:15]  BLOOD ADMINISTERED:none  DRAINS: none   LOCAL MEDICATIONS USED:  MARCAINE   , Amount: 60 ml and OTHER epi  SPECIMEN:  No Specimen  DISPOSITION OF SPECIMEN:  N/A  COUNTS:  YES  TOURNIQUET:   Total Tourniquet Time Documented: Thigh (Left) - 67 minutes  DICTATION: .Dragon Dictation  PLAN OF CARE: Discharge to home after PACU  PATIENT DISPOSITION:  PACU - hemodynamically stable.   Delay start of Pharmacological VTE agent (>24hrs) due to surgical blood loss or risk of bleeding: not applicable

## 2012-03-11 NOTE — Discharge Instructions (Signed)
Bimalleolar Fracture, Ankle, Adult, Displaced (ORIF) A bimalleolar fracture (break in bone) is two fractures in the lower bones of your leg that help to make up your ankle. These fractures are in the bone you feel as the bump on the outside of your ankle (fibula) and the bone that you feel as the bump on the inside of your ankle (tibia). Your fractures are displaced. This means the bones are not in their normal position and will not give a good result if they heal in that position. Because of this, surgery is required. This is called an open reduction and internal fixation (ORIF). Even with the best of care and perfect results this ankle may be more prone to be arthritis later due to damage of the cartilage lining the ankle joint which is not visible on X-ray. These fractures are easily diagnosed with X-rays. TREATMENT  You have fractures that would probably heal with disability, without surgery. Open reduction means that the area of the fracture is opened up to the vision of the surgeon and internal fixation means that a screw, pins or fixation device is used to hold the boney pieces in place. Following surgery a short-leg cast or removable fracture boot is then applied from your toes to below your knee. This is generally left in place for about 5 to 6 weeks, during which time it is followed by your caregiver and X-rays may be taken to make sure the bones stay in place. RISKS & COMPLICATIONS: All surgery is associated with risks. Some of these risks are:  Excessive bleeding   Infection   Failure to heal properly resulting in an unstable or arthritic ankle   Stiffness of ankle following repair  LET YOUR CAREGIVERS KNOW ABOUT:  Allergies.   Medications taken including herbs, eye drops, over-the-counter medications, and creams.   Use of steroids (by mouth or creams).   History of bleeding or blood problems.   Previous problems with anesthetics or numbing medication, including a family history  of these problems.   Possibility of pregnancy, if this applies.   History of blood clots (thrombophlebitis).   Previous surgery.   Other health problems.  BEFORE AND AFTER YOUR SURGERY Prior to surgery an IV (intravenous line connected to your vein for giving fluids) may be started and you will be given an anesthetic (medications and gas to make you sleep). You may also be given a regional anesthetic such as a spinal or epidural block. After surgery, you will be taken to the recovery area where a nurse will monitor your progress. You may have a catheter (a long, narrow, hollow tube) in your bladder following surgery that helps you pass your water. When you are awake, are stable, taking fluids well and without complications, you will be returned to your room. You will receive physical therapy and other care until you are doing well and your caregiver feels it is safe for you to be transferred either to home or to an extended care facility. HOME CARE INSTRUCTIONS   You may resume normal diet and activities as directed or allowed.   Do not drive a vehicle until your caregiver specifically tells you it is safe to do so.   Keep ice packs (a bag of ice wrapped in a towel) on the surgical area for 20 minutes, 4 times per day, for the first two days following surgery. Use the ice only if okay with your surgeon or caregiver.   Elevate your ankle above your heart as much  as possible for the first 24 to 48 hours after the operation.   Change dressings if necessary or as directed.   If you have a plaster or fiberglass cast:   Do not try to scratch the skin under the cast using sharp or pointed objects.   Check the skin around the cast every day. You may put lotion on any red or sore areas.   Keep your cast dry and clean.   Do not put pressure on any part of your cast or splint until it is fully hardened.   Your cast or splint can be protected during bathing with a plastic bag. Do not lower the  cast or splint into water.   Only take over-the-counter or prescription medicines for pain, discomfort, or fever as directed by your caregiver.   Use crutches as directed and do not exercise leg unless instructed.   These are not fractures to be taken lightly! If these bones become displaced and get out of position, it may eventually lead to arthritis and disability for the rest of your life. Problems often follow even the best of care. Follow the directions of your caregiver.   Keep appointments as directed.  SEEK IMMEDIATE MEDICAL CARE IF:   Redness, swelling, numbness, or increasing pain in the wound.   Pus coming from wound.   An unexplained oral temperature above 102 F (38.9 C) or as your caregiver suggests.   A bad smell coming from the wound or dressing.   A breaking open of the wound (edges not staying together) after sutures or staples have been removed.   Your skin or nails below the injury turn blue or gray, or feel cold or numb.   You develop severe pain under the cast or in your foot. Especially when someone else moves your toes.  Follow all instructions given to you by your caregiver, make and keep follow up appointments, and use crutches as directed. If you do not have a window in your cast for observing the wound, a discharge, or minor bleeding may show up as a stain on the outside of your dressings, your cast, or plaster splint. Report these findings to your caregiver. MAKE SURE YOU:   Understand these instructions.   Will watch your condition.   Will get help right away if you are not doing well or get worse.  Document Released: 07/09/2005 Document Revised: 09/18/2011 Document Reviewed: 05/03/2008 [facility] Patient Information 2012 [place], [place].

## 2012-03-11 NOTE — Op Note (Signed)
03/11/2012  2:57 PM  PATIENT:  Margaret [name]  41 y.o. female  PRE-OPERATIVE DIAGNOSIS:  Bimalleolar left ankle fracture  POST-OPERATIVE DIAGNOSIS:  Bimalleolar left ankle fracture  PROCEDURE:  Procedure(s) (LRB): OPEN REDUCTION INTERNAL FIXATION (ORIF) ANKLE FRACTURE (Left)  SYNTHES PLATE 7 HOLES WITH 6 SCREWS + 1 INTERFRAG SCREW  2 MEDIAL k WIRES ( 1.2)    DETAILS OF PROCEDURE:  SITE WAS CONFIRMED IN PREOP AREA AND MARKED. SHE WAS THEN TAKEN TO THE OR FOR GENERAL ANAESTHESIA 2 GM OF ANCEF WAS GIVEN  IN THE SUPINE POSITION THE LIMB WAS PREPPED AND DRAPED. THE TOURNIQUET WAS ELEVATED TO 300 MM AND THE TIME OUT WAS COMPLETED   A LATERAL INCISION WAS MADE TO EXPOSE THE FRACTURE. THE FRACTURE WAS REDUCED AND HELD WITH CLAMPS AND HELD WITH CLAMPS. XRAYS CONFIRMED THE REDUCTION  1 INTERFRAG SCREW WAS PLACED IN LAG FASHION. A CONTOURED 1/3 TUBULAR PLATED WAS APPLIED TO THE FIBULA IN AO TECHNIQUE.   THE MEDIAL MALLEOLUS WAS EXPOSED AND REDUCED WITH BONE CLAMP. 2- 1.25 K WIRES WERE PLACED TO HOLD THE FRACTURE.  XRAYS CONFIRMED THE REDUCTION , THE WIRES WERE BENT THEN CUT AND BURIED IN THE BONE   BOTH WOUNDS WERE THOROUGHLY IRRIGATED   THE MEDIAL SIDE WAS CLOSED WITH INTERUPTED 3-0 NYLON SUTURES   THE LATERAL SIDE WAS CLOSED WITH 0 MONOCRYL AND STAPLES   60 CC MARCAINE WITH EPI WAS INJECTED INTO THE WOUNDS   A SPLINT WAS APPLIED WITH THE FOOT AT 90 DEGREES TO THE LEG   SHE WAS EXTUBATED AND TAKEN TO RECOVERY IN STABLE CONDITION   SURGEON:  Surgeon(s) and Role:    * Vickki Hearing, MD - Primary  PHYSICIAN ASSISTANT:   ASSISTANTS: none   ANESTHESIA:   general  EBL:  Total I/O In: 1000 [I.V.:1000] Out: 15 [Blood:15]  BLOOD ADMINISTERED:none  DRAINS: none   LOCAL MEDICATIONS USED:  MARCAINE   , Amount: 60 ml and OTHER epi  SPECIMEN:  No Specimen  DISPOSITION OF SPECIMEN:  N/A  COUNTS:  YES  TOURNIQUET:   Total Tourniquet Time Documented: Thigh (Left) - 67  minutes  DICTATION: .Dragon Dictation  PLAN OF CARE: Discharge to home after PACU  PATIENT DISPOSITION:  PACU - hemodynamically stable.   Delay start of Pharmacological VTE agent (>24hrs) due to surgical blood loss or risk of bleeding: not applicable

## 2012-03-11 NOTE — Anesthesia Preprocedure Evaluation (Signed)
Anesthesia Evaluation  Patient identified by MRN, date of birth, ID band Patient awake    Reviewed: Allergy & Precautions, H&P , NPO status , Patient's Chart, lab work & pertinent test results  History of Anesthesia Complications Negative for: history of anesthetic complications (pruritis w/ intrathecal narcotics)  Airway Mallampati: II TM Distance: >3 FB     Dental  (+) Teeth Intact   Pulmonary neg pulmonary ROS,  breath sounds clear to auscultation        Cardiovascular negative cardio ROS  Rhythm:Regular Rate:Normal     Neuro/Psych    GI/Hepatic   Endo/Other    Renal/GU      Musculoskeletal   Abdominal   Peds  Hematology   Anesthesia Other Findings   Reproductive/Obstetrics                           Anesthesia Physical Anesthesia Plan  ASA: I  Anesthesia Plan: General   Post-op Pain Management:    Induction: Intravenous  Airway Management Planned: LMA  Additional Equipment:   Intra-op Plan:   Post-operative Plan: Extubation in OR  Informed Consent: I have reviewed the patients History and Physical, chart, labs and discussed the procedure including the risks, benefits and alternatives for the proposed anesthesia with the patient or authorized representative who has indicated his/her understanding and acceptance.     Plan Discussed with:   Anesthesia Plan Comments:         Anesthesia Quick Evaluation

## 2012-03-11 NOTE — Anesthesia Postprocedure Evaluation (Signed)
Anesthesia Post Note  Patient: Margaret [name]  Procedure(s) Performed: Procedure(s) (LRB): OPEN REDUCTION INTERNAL FIXATION (ORIF) ANKLE FRACTURE (Left)  Anesthesia type: General  Patient location: PACU  Post pain: Pain level controlled  Post assessment: Post-op Vital signs reviewed, Patient's Cardiovascular Status Stable, Respiratory Function Stable, Patent Airway, No signs of Nausea or vomiting and Pain level controlled  Last Vitals:  Filed Vitals:   03/11/12 1515  BP: 134/78  Pulse: 105  Temp:   Resp: 20    Post vital signs: Reviewed and stable  Level of consciousness: awake and alert   Complications: No apparent anesthesia complications

## 2012-03-11 NOTE — Anesthesia Procedure Notes (Signed)
Procedure Name: LMA Insertion Date/Time: 03/11/2012 1:15 PM Performed by: Franco Nones Pre-anesthesia Checklist: Patient identified, Patient being monitored, Emergency Drugs available, Timeout performed and Suction available Patient Re-evaluated:Patient Re-evaluated prior to inductionOxygen Delivery Method: Circle System Utilized Preoxygenation: Pre-oxygenation with 100% oxygen Intubation Type: IV induction Ventilation: Mask ventilation without difficulty LMA: LMA inserted LMA Size: 4.0 Number of attempts: 1 Placement Confirmation: positive ETCO2 and breath sounds checked- equal and bilateral Tube secured with: Tape

## 2012-03-15 ENCOUNTER — Encounter: Payer: Self-pay | Admitting: Orthopedic Surgery

## 2012-03-15 ENCOUNTER — Ambulatory Visit (INDEPENDENT_AMBULATORY_CARE_PROVIDER_SITE_OTHER): Payer: BC Managed Care – PPO | Admitting: Orthopedic Surgery

## 2012-03-15 VITALS — BP 102/70 | Ht 65.0 in | Wt 195.0 lb

## 2012-03-15 DIAGNOSIS — R0602 Shortness of breath: Secondary | ICD-10-CM | POA: Insufficient documentation

## 2012-03-15 DIAGNOSIS — Z9889 Other specified postprocedural states: Secondary | ICD-10-CM | POA: Insufficient documentation

## 2012-03-15 DIAGNOSIS — S82843A Displaced bimalleolar fracture of unspecified lower leg, initial encounter for closed fracture: Secondary | ICD-10-CM

## 2012-03-15 DIAGNOSIS — R42 Dizziness and giddiness: Secondary | ICD-10-CM | POA: Insufficient documentation

## 2012-03-15 DIAGNOSIS — IMO0002 Reserved for concepts with insufficient information to code with codable children: Secondary | ICD-10-CM

## 2012-03-15 DIAGNOSIS — R209 Unspecified disturbances of skin sensation: Secondary | ICD-10-CM | POA: Insufficient documentation

## 2012-03-15 NOTE — Patient Instructions (Signed)
Toe touch weight bearing in brace elevate as needed

## 2012-03-15 NOTE — Progress Notes (Signed)
Patient ID: Margaret [name], female   DOB: 1970/12/17, 41 y.o.   MRN: [id number] Chief Complaint  Patient presents with  . Follow-up    Post op left ankle. 03/11/2012 internal fixation left ankle    BP 102/70  Ht 5\' 5"  (1.651 m)  Wt 195 lb (88.451 kg)  BMI 32.45 kg/m2  Left ankle ORIF doing well complains of some discomfort at night. Her ankle looks very good she has minimal swelling I helped her with some ankle range of motion exercises. We placed her in an Aircast to allow range of motion she can be toe touch weightbearing she should come back in about 8 or 9 days to have the staples removed the stitches taken out and the ankle x-rayed.

## 2012-03-16 ENCOUNTER — Encounter (HOSPITAL_COMMUNITY): Payer: Self-pay

## 2012-03-16 ENCOUNTER — Emergency Department (HOSPITAL_COMMUNITY)
Admission: EM | Admit: 2012-03-16 | Discharge: 2012-03-16 | Disposition: A | Discharge status: Home / Self Care | Payer: BC Managed Care – PPO | Attending: Emergency Medicine | Admitting: Emergency Medicine

## 2012-03-16 ENCOUNTER — Emergency Department (HOSPITAL_COMMUNITY): Payer: BC Managed Care – PPO

## 2012-03-16 ENCOUNTER — Telehealth: Payer: Self-pay | Admitting: Orthopedic Surgery

## 2012-03-16 DIAGNOSIS — R0602 Shortness of breath: Secondary | ICD-10-CM

## 2012-03-16 LAB — CBC
MCH: 32.7 pg (ref 26.0–34.0)
Platelets: 294 10*3/uL (ref 150–400)
RBC: 4.07 MIL/uL (ref 3.87–5.11)
RDW: 12.6 % (ref 11.5–15.5)

## 2012-03-16 LAB — BASIC METABOLIC PANEL
BUN: 9 mg/dL (ref 6–23)
Calcium: 9.8 mg/dL (ref 8.4–10.5)
Creatinine, Ser: 0.61 mg/dL (ref 0.50–1.10)
GFR calc non Af Amer: >90 mL/min (ref >90)
Glucose, Bld: 113 mg/dL — ABNORMAL HIGH (ref 70–99)
Sodium: 138 mEq/L (ref 135–145)

## 2012-03-16 LAB — PROTIME-INR: Prothrombin Time: 13.6 seconds (ref 11.6–15.2)

## 2012-03-16 MED ORDER — IOHEXOL 350 MG/ML SOLN
100.0000 mL | Freq: Once | INTRAVENOUS | Status: AC | PRN
  Administered 2012-03-16: 100 mL via INTRAVENOUS

## 2012-03-16 NOTE — Discharge Instructions (Signed)
Your blood work with the exception of the d-dimer was normal. Your CT scan of your chest does NOT show any sign of a blood clot. There is no explanation of the sensation and feelings your had however, there is no life threatening process that we can find at this time. Continue your current medicines. Follow up with your doctors.

## 2012-03-16 NOTE — ED Provider Notes (Signed)
History     CSN: [id number]  Arrival date & time 03/15/12  2351   First MD Initiated Contact with Patient 03/16/12 0253      Chief Complaint  Patient presents with  . Shortness of Breath  . Tingling    (Consider location/radiation/quality/duration/timing/severity/associated sxs/prior treatment) HPI  Margaret [name] is a 41 y.o. female who presents to the Emergency Department complaining of two episodes of shortness of breath associated with tingling in her hands and feet and a feeling of being unable to take a deep breath. Patient has recently undergone open reduction and fixation of the left ankle. Denies fever, chills, nausea, vomiting, cough.   PCP Dr. Dorcas Carrow  Dr. Romeo Apple  Past Medical History  Diagnosis Date  . Complication of anesthesia     Severe itching after spinal (C-Section)    Past Surgical History  Procedure Date  . Cesarean section   . Ankle fracture surgery     Family History  Problem Relation Age of Onset  . Cancer    . Diabetes    . Kidney disease    . Anesthesia problems Neg Hx   . Hypotension Neg Hx   . Malignant hyperthermia Neg Hx   . Pseudochol deficiency Neg Hx     History  Substance Use Topics  . Smoking status: Current Everyday Smoker -- 0.5 packs/day for 22 years    Types: Cigarettes  . Smokeless tobacco: Not on file  . Alcohol Use: Yes     occassional    OB History    Grav Para Term Preterm Abortions TAB SAB Ect Mult Living                  Review of Systems  Constitutional: Negative for fever.       10 Systems reviewed and are negative for acute change except as noted in the HPI.  HENT: Negative for congestion.   Eyes: Negative for discharge and redness.  Respiratory: Positive for shortness of breath. Negative for cough.   Cardiovascular: Negative for chest pain.  Gastrointestinal: Negative for vomiting and abdominal pain.  Musculoskeletal: Negative for back pain.  Skin: Negative for rash.  Neurological: Positive  for numbness. Negative for syncope and headaches.  Psychiatric/Behavioral:       No behavior change.    Allergies  Review of patient's allergies indicates no known allergies.  Home Medications   Current Outpatient Rx  Name Route Sig Dispense Refill  . VITAMIN D 1000 UNITS PO TABS Oral Take 1,000 Units by mouth daily.    Marland Kitchen CRANBERRY 450 MG PO TABS Oral Take 1 tablet by mouth daily.    . OMEGA-3 FATTY ACIDS 1000 MG PO CAPS Oral Take 1 g by mouth daily.    . L-METHYLFOLATE-B6-B12 3-35-2 MG PO TABS Oral Take 1 tablet by mouth daily.    . OXYCODONE-ACETAMINOPHEN 5-325 MG PO TABS Oral Take 1 tablet by mouth every 4 (four) hours as needed for pain. 90 tablet 0  . OXYCODONE-ACETAMINOPHEN 5-325 MG PO TABS Oral Take 1 tablet by mouth every 4 (four) hours as needed for pain. 90 tablet 0  . PROMETHAZINE HCL 12.5 MG PO TABS Oral Take 1 tablet (12.5 mg total) by mouth every 6 (six) hours as needed for nausea. 30 tablet 0    BP 120/69  Pulse 70  Temp(Src) 98.3 F (36.8 C) (Oral)  Resp 18  Ht 5\' 4"  (1.626 m)  Wt 190 lb (86.183 kg)  BMI 32.61 kg/m2  SpO2  97%  Physical Exam  Nursing note and vitals reviewed. Constitutional: She appears well-developed and well-nourished. No distress.       Awake, alert, nontoxic appearance.  HENT:  Head: Atraumatic.  Eyes: Right eye exhibits no discharge. Left eye exhibits no discharge.  Neck: Neck supple.  Cardiovascular: Normal rate and normal heart sounds.   Pulmonary/Chest: Effort normal and breath sounds normal. She has no wheezes. She has no rales. She exhibits no tenderness.  Abdominal: Soft. There is no tenderness. There is no rebound.  Musculoskeletal: She exhibits no tenderness.       Baseline ROM, no obvious new focal weakness.Left foot in an ACE wrap and splint.  Neurological:       Mental status and motor strength appears baseline for patient and situation.  Skin: No rash noted.  Psychiatric: She has a normal mood and affect.    ED Course    Procedures (including critical care time) Results for orders placed during the hospital encounter of 03/16/12  CBC      Component Value Range   WBC 9.3  4.0 - 10.5 (K/uL)   RBC 4.07  3.87 - 5.11 (MIL/uL)   Hemoglobin 13.3  12.0 - 15.0 (g/dL)   HCT 16.1  09.6 - 04.5 (%)   MCV 95.3  78.0 - 100.0 (fL)   MCH 32.7  26.0 - 34.0 (pg)   MCHC 34.3  30.0 - 36.0 (g/dL)   RDW 40.9  81.1 - 91.4 (%)   Platelets 294  150 - 400 (K/uL)  PROTIME-INR      Component Value Range   Prothrombin Time 13.6  11.6 - 15.2 (seconds)   INR 1.02  0.00 - 1.49   APTT      Component Value Range   aPTT 30  24 - 37 (seconds)  BASIC METABOLIC PANEL      Component Value Range   Sodium 138  135 - 145 (mEq/L)   Potassium 4.5  3.5 - 5.1 (mEq/L)   Chloride 105  96 - 112 (mEq/L)   CO2 24  19 - 32 (mEq/L)   Glucose, Bld 113 (*) 70 - 99 (mg/dL)   BUN 9  6 - 23 (mg/dL)   Creatinine, Ser 7.82  0.50 - 1.10 (mg/dL)   Calcium 9.8  8.4 - 95.6 (mg/dL)   GFR calc non Af Amer >90  >90 (mL/min)   GFR calc Af Amer >90  >90 (mL/min)  D-DIMER, QUANTITATIVE      Component Value Range   D-Dimer, Quant 1.08 (*) 0.00 - 0.48 (ug/mL-FEU)   Dg Chest 2 View  03/16/2012  *RADIOLOGY REPORT*  Clinical Data: Shortness of breath.  CHEST - 2 VIEW  Comparison: None  Findings: The cardiac silhouette, mediastinal and hilar contours are within normal limits.  Minimal streaky bibasilar atelectasis but no infiltrates or effusions.  The bony thorax is intact.  IMPRESSION: Streaky bibasilar atelectasis, left greater than right but no infiltrates or effusions.  Original Report Authenticated By: P. Loralie Champagne, M.D.   Ct Angio Chest W/cm &/or Wo Cm  03/16/2012  *RADIOLOGY REPORT*  Clinical Data: Post foot surgery, now with shortness of breath and dizziness, elevated D-dimer, evaluate for pulmonary embolism  CT ANGIOGRAPHY CHEST  Technique:  Multidetector CT imaging of the chest using the standard protocol during bolus administration of intravenous  contrast. Multiplanar reconstructed images including MIPs were obtained and reviewed to evaluate the vascular anatomy.  Contrast: OMNIPAQUE IOHEXOL 350 MG/ML SOLN  Comparison: Chest radiograph - 03/16/2012  Vascular Findings:  There is adequate opacification of the pulmonary vasculature with the main pulmonary artery measuring 318 HU.  There are no discrete filling defects within the pulmonary arteries to suggest acute pulmonary embolism, though note there is suboptimal evaluation of the distal subsegmental pulmonary arteries secondary to attenuation patient body habitus.  Normal caliber of the main pulmonary artery.  Normal heart size.  No pericardial effusion.  Normal size of the thoracic aorta.  Normal configuration of the aortic arch.  The visualized portions of the major cervical branch vessels are patent.  No periaortic stranding.  Nonvascular findings:  Symmetric bibasilar dependent ground-glass atelectasis.  No focal airspace opacities.  No pleural effusion or pneumothorax.  No discrete pulmonary nodules.  Central airways are patent.  No mediastinal, hilar or axillary lymphadenopathy.  Limited early arterial phase evaluation of the upper abdomen is unremarkable.  Incidental note is made of a small splenule.  No acute or aggressive osseous abnormalities.  IMPRESSION: No acute findings within the chest.  Specifically, no evidence of pulmonary embolism or pneumonia.  Original Report Authenticated By: Waynard Reeds, M.D.    MDM  Patient with recent orthopedic procedure who presents with 2 episodes of shortness of breath associated with numbness and tingling of the extremities and a subjective feeling of shortness of breath. D-dimer was positive. Chest x-ray was unremarkable. Chest CT showed no evidence of pulmonary embolus which was the primary concern.Dx testing d/w pt and family.  Questions answered.  Verb understanding, agreeable to d/c home with outpt f/u.Pt stable in ED with no significant  deterioration in condition.The patient appears reasonably screened and/or stabilized for discharge and I doubt any other medical condition or other [facility] requiring further screening, evaluation, or treatment in the ED at this time prior to discharge.  MDM Reviewed: nursing note and vitals Interpretation: labs, CT scan and x-ray            Nicoletta Dress. Colon Branch, MD 03/17/12 [phone]

## 2012-03-16 NOTE — Telephone Encounter (Signed)
Patient's husband called to relay that he had taken patient to [facility] Emergency Department last night, due to difficulty breathing and some other medical issues.  States patient was "afraid she may have a blood clot."  States patient was treated and some tests were done.  States he wanted Dr. Romeo Apple to be aware.  Please advise if anything is needed from orthopedic standpoint. Patient's next scheduled appointment is 03/24/12.

## 2012-03-16 NOTE — ED Notes (Signed)
Got SOB twice, tingling in hands and feet per pt. Got nauseated the second time and dizzy per pt. Had left ankle surgery on Thursday and went to see my surgeon yesterday and it was a good check up. Have been taking oxycodone for pain per pt.

## 2012-03-16 NOTE — Telephone Encounter (Signed)
Follow up on this tomorrow

## 2012-03-17 MED FILL — Oxycodone w/ Acetaminophen Tab 5-325 MG: ORAL | Qty: 6 | Status: AC

## 2012-03-17 NOTE — Telephone Encounter (Signed)
Called patient as follow up per Dr. Mort Sawyers note.  Reached patient, she states she is feeling much better today.  States she underwent several tests, and "all were okay."  She said that they determined she may have been experiencing the symptoms of "wave through chest, chest tightening, dizziness, nausea" as a result of being tired and anxious from the surgery, also that she is not used to being on medication.  Said she has not had these symptoms since.  States she is being aware of how she moves, and so far is feeling well.  Her home phone # is [phone] [facility]). States she will be here as scheduled 03/24/12.

## 2012-03-24 ENCOUNTER — Ambulatory Visit (INDEPENDENT_AMBULATORY_CARE_PROVIDER_SITE_OTHER): Payer: BC Managed Care – PPO

## 2012-03-24 ENCOUNTER — Encounter: Payer: Self-pay | Admitting: Orthopedic Surgery

## 2012-03-24 ENCOUNTER — Ambulatory Visit (INDEPENDENT_AMBULATORY_CARE_PROVIDER_SITE_OTHER): Payer: BC Managed Care – PPO | Admitting: Orthopedic Surgery

## 2012-03-24 VITALS — BP 90/58 | Ht 65.0 in | Wt 195.0 lb

## 2012-03-24 DIAGNOSIS — S82899A Other fracture of unspecified lower leg, initial encounter for closed fracture: Secondary | ICD-10-CM

## 2012-03-24 NOTE — Patient Instructions (Addendum)
4 weeks xrays  Partial weight bearing in air cast with walker Will will

## 2012-03-24 NOTE — Progress Notes (Signed)
Patient ID: Margaret [name], female   DOB: Oct 12, 1971, 41 y.o.   MRN: [id number] Chief Complaint  Patient presents with  . Follow-up    9 days recheck on left ankle with staples and sutures out.    DOS: 03-11-2012 OTIF LEFT ANKLE   CURRENTLY IN AIR CAST   Sutures and staples removed. Wounds are clean.  X-ray was taken today.  Continue Aircast start partial weightbearing.  Return 4 weeks for repeat x-ray

## 2012-04-22 ENCOUNTER — Ambulatory Visit (INDEPENDENT_AMBULATORY_CARE_PROVIDER_SITE_OTHER): Payer: BC Managed Care – PPO

## 2012-04-22 ENCOUNTER — Ambulatory Visit (INDEPENDENT_AMBULATORY_CARE_PROVIDER_SITE_OTHER): Payer: BC Managed Care – PPO | Admitting: Orthopedic Surgery

## 2012-04-22 ENCOUNTER — Encounter: Payer: Self-pay | Admitting: Orthopedic Surgery

## 2012-04-22 VITALS — BP 120/80 | Ht 65.0 in | Wt 195.0 lb

## 2012-04-22 DIAGNOSIS — S82899A Other fracture of unspecified lower leg, initial encounter for closed fracture: Secondary | ICD-10-CM

## 2012-04-22 DIAGNOSIS — S82892A Other fracture of left lower leg, initial encounter for closed fracture: Secondary | ICD-10-CM | POA: Insufficient documentation

## 2012-04-22 NOTE — Progress Notes (Signed)
Patient ID: Margaret [name], female   DOB: 1970-12-21, 41 y.o.   MRN: [id number] Chief Complaint  Patient presents with  . Follow-up    4 week follow up and xray Left ankle, DOS 03/11/12    6 week followup x-ray OTIF LEFT ankle.  X-ray shows fracture consolidation R. Risen to  Recommend CAM walker, short, full weightbearing as tolerated. X-ray in 6 weeks

## 2012-04-22 NOTE — Patient Instructions (Addendum)
Weight bearing as tolerated   Apply neosporin to wound twice a day as needed if scab comes off

## 2012-05-24 ENCOUNTER — Other Ambulatory Visit: Payer: Self-pay | Admitting: Obstetrics and Gynecology

## 2012-05-24 DIAGNOSIS — Z1231 Encounter for screening mammogram for malignant neoplasm of breast: Secondary | ICD-10-CM

## 2012-06-03 ENCOUNTER — Ambulatory Visit (INDEPENDENT_AMBULATORY_CARE_PROVIDER_SITE_OTHER): Payer: BC Managed Care – PPO

## 2012-06-03 ENCOUNTER — Ambulatory Visit (INDEPENDENT_AMBULATORY_CARE_PROVIDER_SITE_OTHER): Payer: BC Managed Care – PPO | Admitting: Orthopedic Surgery

## 2012-06-03 ENCOUNTER — Encounter: Payer: Self-pay | Admitting: Orthopedic Surgery

## 2012-06-03 VITALS — BP 90/64 | Ht 65.0 in | Wt 190.0 lb

## 2012-06-03 DIAGNOSIS — S82892A Other fracture of left lower leg, initial encounter for closed fracture: Secondary | ICD-10-CM

## 2012-06-03 DIAGNOSIS — S82899A Other fracture of unspecified lower leg, initial encounter for closed fracture: Secondary | ICD-10-CM

## 2012-06-03 NOTE — Patient Instructions (Signed)
ASO AS NEEDED

## 2012-06-03 NOTE — Progress Notes (Signed)
Patient ID: Margaret [name], female   DOB: August 24, 1971, 41 y.o.   MRN: [id number] Chief Complaint  Patient presents with  . Follow-up    6 week recheck and xray left ankle    BP 90/64  Ht 5\' 5"  (1.651 m)  Wt 190 lb (86.183 kg)  BMI 31.62 kg/m2  Date of surgery Mar 11, 2012  Procedure open treatment internal fixation, LEFT ankle, bimalleolar fracture.  X-rays today.  X-rays show fracture healing mortise intact hardware intact.  Clinical exam shows mild tightness in the Achilles and some residual nerve contusion with sensory changes in the dorsum of the foot. Toes extend normally. Ankle dorsiflexion 5, plantar flexion, 15.  Plain incisions.  The patient is placed in an ASO for comfort.  Weightbearing as tolerated.  3 month temporary handicap sticker.  Followup as needed

## 2012-06-17 ENCOUNTER — Ambulatory Visit
Admission: RE | Admit: 2012-06-17 | Discharge: 2012-06-17 | Disposition: A | Discharge status: Home / Self Care | Payer: BC Managed Care – PPO | Source: Ambulatory Visit | Attending: Obstetrics and Gynecology | Admitting: Obstetrics and Gynecology

## 2012-06-17 DIAGNOSIS — Z1231 Encounter for screening mammogram for malignant neoplasm of breast: Secondary | ICD-10-CM

## 2012-10-13 HISTORY — PX: COLONOSCOPY: SHX174

## 2012-11-08 ENCOUNTER — Other Ambulatory Visit: Payer: Self-pay | Admitting: Adult Health

## 2012-11-08 ENCOUNTER — Other Ambulatory Visit (HOSPITAL_COMMUNITY)
Admission: RE | Admit: 2012-11-08 | Discharge: 2012-11-08 | Disposition: A | Discharge status: Home / Self Care | Payer: BC Managed Care – PPO | Source: Ambulatory Visit | Attending: Obstetrics and Gynecology | Admitting: Obstetrics and Gynecology

## 2012-11-08 DIAGNOSIS — Z01419 Encounter for gynecological examination (general) (routine) without abnormal findings: Secondary | ICD-10-CM | POA: Insufficient documentation

## 2012-11-08 DIAGNOSIS — Z1151 Encounter for screening for human papillomavirus (HPV): Secondary | ICD-10-CM | POA: Insufficient documentation

## 2013-03-15 ENCOUNTER — Telehealth: Payer: Self-pay | Admitting: Adult Health

## 2013-03-15 MED ORDER — SULFAMETHOXAZOLE-TRIMETHOPRIM 800-160 MG PO TABS: 1.0000 | ORAL_TABLET | Freq: Two times a day (BID) | ORAL | Status: DC

## 2013-03-15 NOTE — Telephone Encounter (Signed)
Margaret [name] called has boil left labia area, has these several times per year, will rx septra ds 1 bid x 10 days with 1 refill at [facility], she has already started boil ez.

## 2013-05-05 ENCOUNTER — Ambulatory Visit (INDEPENDENT_AMBULATORY_CARE_PROVIDER_SITE_OTHER): Payer: BC Managed Care – PPO | Admitting: Orthopedic Surgery

## 2013-05-05 ENCOUNTER — Encounter: Payer: Self-pay | Admitting: Orthopedic Surgery

## 2013-05-05 ENCOUNTER — Ambulatory Visit (INDEPENDENT_AMBULATORY_CARE_PROVIDER_SITE_OTHER): Payer: BC Managed Care – PPO

## 2013-05-05 VITALS — BP 115/78 | Ht 65.0 in | Wt 189.0 lb

## 2013-05-05 DIAGNOSIS — S82899A Other fracture of unspecified lower leg, initial encounter for closed fracture: Secondary | ICD-10-CM | POA: Insufficient documentation

## 2013-05-05 DIAGNOSIS — S82892D Other fracture of left lower leg, subsequent encounter for closed fracture with routine healing: Secondary | ICD-10-CM

## 2013-05-05 DIAGNOSIS — IMO0001 Reserved for inherently not codable concepts without codable children: Secondary | ICD-10-CM

## 2013-05-05 DIAGNOSIS — S82842D Displaced bimalleolar fracture of left lower leg, subsequent encounter for closed fracture with routine healing: Secondary | ICD-10-CM

## 2013-05-05 NOTE — Progress Notes (Signed)
Patient ID: Margaret [name], female   DOB: Mar 25, 1971, 42 y.o.   MRN: [id number] Chief Complaint  Patient presents with  . Follow-up    Recheck left ankle. DOS 03-11-12.    Status post left ankle bimalleolar open treatment internal fixation doing well. She complains of some occasional stiffness and dependent edema which resolves after elevation. She can feel her screws but they're not bothering her  Review of systems related to the ankle are otherwise normal  BP 115/78  Ht 5\' 5"  (1.651 m)  Wt 189 lb (85.73 kg)  BMI 31.45 kg/m2 General appearance is normal, the patient is alert and oriented x3 with normal mood and affect. She walks normally no supportive devices. She does exhibit some decreased range of motion in the ankle subtalar joint as well as dorsiflexion. No tenderness incision clean no numbness no tingling normal pulse and perfusion  X-rays are normal  Patient reassured everything is going well  Followup as needed

## 2013-05-27 ENCOUNTER — Other Ambulatory Visit: Payer: Self-pay

## 2013-05-27 DIAGNOSIS — Z1231 Encounter for screening mammogram for malignant neoplasm of breast: Secondary | ICD-10-CM

## 2013-06-23 ENCOUNTER — Ambulatory Visit
Admission: RE | Admit: 2013-06-23 | Discharge: 2013-06-23 | Disposition: A | Discharge status: Home / Self Care | Payer: BC Managed Care – PPO | Source: Ambulatory Visit

## 2013-06-23 DIAGNOSIS — Z1231 Encounter for screening mammogram for malignant neoplasm of breast: Secondary | ICD-10-CM

## 2013-09-12 ENCOUNTER — Telehealth: Payer: Self-pay | Admitting: Orthopedic Surgery

## 2013-09-12 NOTE — Telephone Encounter (Signed)
We can give a metal implant card

## 2013-09-12 NOTE — Telephone Encounter (Signed)
Patient will be flying soon and asked if the metal plate, pins, & screws will set off the metal detectors at the airport.  Do we have any cards for this like we do for [employer] joints?   Phone # [phone]

## 2013-09-13 NOTE — Telephone Encounter (Signed)
Patient aware to pick up the card.

## 2013-12-15 ENCOUNTER — Other Ambulatory Visit: Payer: BC Managed Care – PPO

## 2013-12-15 DIAGNOSIS — Z1329 Encounter for screening for other suspected endocrine disorder: Secondary | ICD-10-CM

## 2013-12-15 DIAGNOSIS — Z13220 Encounter for screening for lipoid disorders: Secondary | ICD-10-CM

## 2013-12-15 DIAGNOSIS — Z0000 Encounter for general adult medical examination without abnormal findings: Secondary | ICD-10-CM

## 2013-12-15 LAB — COMPREHENSIVE METABOLIC PANEL
ALT: 19 U/L (ref 0–35)
AST: 17 U/L (ref 0–37)
Albumin: 4.6 g/dL (ref 3.5–5.2)
Alkaline Phosphatase: 51 U/L (ref 39–117)
BILIRUBIN TOTAL: 0.5 mg/dL (ref 0.2–1.2)
BUN: 9 mg/dL (ref 6–23)
CO2: 26 meq/L (ref 19–32)
CREATININE: 0.67 mg/dL (ref 0.50–1.10)
Calcium: 9.6 mg/dL (ref 8.4–10.5)
Chloride: 103 mEq/L (ref 96–112)
Glucose, Bld: 100 mg/dL — ABNORMAL HIGH (ref 70–99)
Potassium: 4.9 mEq/L (ref 3.5–5.3)
Sodium: 136 mEq/L (ref 135–145)
Total Protein: 6.7 g/dL (ref 6.0–8.3)

## 2013-12-15 LAB — CBC
HCT: 42.6 % (ref 36.0–46.0)
Hemoglobin: 14.5 g/dL (ref 12.0–15.0)
MCH: 31.5 pg (ref 26.0–34.0)
MCHC: 34 g/dL (ref 30.0–36.0)
MCV: 92.4 fL (ref 78.0–100.0)
PLATELETS: 293 10*3/uL (ref 150–400)
RBC: 4.61 MIL/uL (ref 3.87–5.11)
RDW: 13.2 % (ref 11.5–15.5)
WBC: 7.4 10*3/uL (ref 4.0–10.5)

## 2013-12-15 LAB — LIPID PANEL
CHOL/HDL RATIO: 4 ratio
CHOLESTEROL: 194 mg/dL (ref 0–200)
HDL: 48 mg/dL (ref >39)
LDL Cholesterol: 131 mg/dL — ABNORMAL HIGH (ref 0–99)
Triglycerides: 73 mg/dL (ref <150)
VLDL: 15 mg/dL (ref 0–40)

## 2013-12-16 LAB — TSH: TSH: 0.808 u[IU]/mL (ref 0.350–4.500)

## 2013-12-21 ENCOUNTER — Ambulatory Visit (INDEPENDENT_AMBULATORY_CARE_PROVIDER_SITE_OTHER): Payer: BC Managed Care – PPO | Admitting: Adult Health

## 2013-12-21 ENCOUNTER — Encounter: Payer: Self-pay | Admitting: Adult Health

## 2013-12-21 ENCOUNTER — Encounter (INDEPENDENT_AMBULATORY_CARE_PROVIDER_SITE_OTHER): Payer: Self-pay

## 2013-12-21 VITALS — BP 120/84 | HR 76 | Ht 64.0 in | Wt 203.0 lb

## 2013-12-21 DIAGNOSIS — Z01419 Encounter for gynecological examination (general) (routine) without abnormal findings: Secondary | ICD-10-CM

## 2013-12-21 DIAGNOSIS — Z1212 Encounter for screening for malignant neoplasm of rectum: Secondary | ICD-10-CM

## 2013-12-21 LAB — HEMOCCULT GUIAC POC 1CARD (OFFICE): FECAL OCCULT BLD: NEGATIVE

## 2013-12-21 NOTE — Patient Instructions (Signed)
Physical in 1 yearly Mammogram yearly Colonoscopy 2024 Diet and exercise  Try relaxation techniques

## 2013-12-21 NOTE — Progress Notes (Signed)
Patient ID: Margaret [name], female   DOB: [birth year], 43 y.o.   MRN: [id number] History of Present Illness: Margaret [name] is a 43 year old white female, married in for a physical,had normal pap with negative HPV 11/08/12.Has had some stress at work.BP up at end of week some times.Had colonoscopy last year, had polyp and hemorrhoids.   Current Medications, Allergies, Past Medical History, Past Surgical History, Family History and Social History were reviewed in [employer] record.     Review of Systems: patient denies any headaches, blurred vision, shortness of breath, chest pain, abdominal pain, problems with bowel movements, urination, or intercourse. No joint pain or mood swings.Feels overwhelmed at times, she is a principal.    Physical Exam:BP 120/84  Pulse 76  Ht 5\' 4"  (1.626 m)  Wt 203 lb (92.08 kg)  BMI 34.83 kg/m2 General:  Well developed, well nourished, no acute distress Skin:  Warm and dry Neck:  Midline trachea, normal thyroid Lungs; Clear to auscultation bilaterally Breast:  No dominant palpable mass, retraction, or nipple discharge Cardiovascular: Regular rate and rhythm Abdomen:  Soft, non tender, no hepatosplenomegaly Pelvic:  External genitalia is normal in appearance.  The vagina is normal in appearance.  The cervix is smooth.  Uterus is felt to be normal size, shape, and contour.  No adnexal masses or tenderness noted. Rectal: Good sphincter tone, no polyps, or hemorrhoids felt.  Hemoccult negative. Extremities:  No swelling or varicosities noted Psych:  No mood changes, alert and cooperative, seems happy Reviewed labs with pt.declines meds at present.   Impression: Yearly gyn exam no pap    Plan: Physical in 1 year Mammogram yearly Colonoscopy 2024 Diet and exercise Try relaxation techniques Call if needs meds

## 2014-03-07 ENCOUNTER — Encounter: Payer: Self-pay | Admitting: Adult Health

## 2014-03-07 ENCOUNTER — Ambulatory Visit (INDEPENDENT_AMBULATORY_CARE_PROVIDER_SITE_OTHER): Payer: BC Managed Care – PPO | Admitting: Adult Health

## 2014-03-07 VITALS — BP 122/84 | Ht 65.0 in | Wt 205.0 lb

## 2014-03-07 DIAGNOSIS — F419 Anxiety disorder, unspecified: Secondary | ICD-10-CM

## 2014-03-07 DIAGNOSIS — F411 Generalized anxiety disorder: Secondary | ICD-10-CM

## 2014-03-07 HISTORY — DX: Anxiety disorder, unspecified: F41.9

## 2014-03-07 MED ORDER — ESCITALOPRAM OXALATE 10 MG PO TABS: 10.0000 mg | ORAL_TABLET | Freq: Every day | ORAL | Status: DC

## 2014-03-07 NOTE — Patient Instructions (Signed)
Generalized Anxiety Disorder Generalized anxiety disorder (GAD) is a mental disorder. It interferes with life functions, including relationships, work, and school. GAD is different from normal anxiety, which everyone experiences at some point in their lives in response to specific life events and activities. Normal anxiety actually helps [place] prepare for and get through these life events and activities. Normal anxiety goes away after the event or activity is over.  GAD causes anxiety that is not necessarily related to specific events or activities. It also causes excess anxiety in proportion to specific events or activities. The anxiety associated with GAD is also difficult to control. GAD can vary from mild to severe. People with severe GAD can have intense waves of anxiety with physical symptoms (panic attacks).  SYMPTOMS The anxiety and worry associated with GAD are difficult to control. This anxiety and worry are related to many life events and activities and also occur more days than not for 6 months or longer. People with GAD also have three or more of the following symptoms (one or more in children):  Restlessness.   Fatigue.  Difficulty concentrating.   Irritability.  Muscle tension.  Difficulty sleeping or unsatisfying sleep. DIAGNOSIS GAD is diagnosed through an assessment by your caregiver. Your caregiver will ask you questions aboutyour mood,physical symptoms, and events in your life. Your caregiver may ask you about your medical history and use of alcohol or drugs, including prescription medications. Your caregiver may also do a physical exam and blood tests. Certain medical conditions and the use of certain substances can cause symptoms similar to those associated with GAD. Your caregiver may refer you to a mental health specialist for further evaluation. TREATMENT The following therapies are usually used to treat GAD:   Medication Antidepressant medication usually is  prescribed for long-term daily control. Antianxiety medications may be added in severe cases, especially when panic attacks occur.   Talk therapy (psychotherapy) Certain types of talk therapy can be helpful in treating GAD by providing support, education, and guidance. A form of talk therapy called cognitive behavioral therapy can teach you healthy ways to think about and react to daily life events and activities.  Stress managementtechniques These include yoga, meditation, and exercise and can be very helpful when they are practiced regularly. A mental health specialist can help determine which treatment is best for you. Some people see improvement with one therapy. However, other people require a combination of therapies. Document Released: 01/24/2013 Document Reviewed: 01/24/2013 [facility] Patient Information 2014 [place], [place]. Take lexpro daily  Follow up in 4 weeks

## 2014-03-07 NOTE — Progress Notes (Signed)
Subjective:     Patient ID: Margaret [name], female   DOB: [birth year], 43 y.o.   MRN: [id number]  HPI Margaret [name] is a 43 year old white female in having been seen yesterday in ER in [place] for panic attack.She had tingling in hands and feet, face was numb and chest was tight and she could not catch her breath.She had a EKG and chest  Xray and labs, and was given xanax to bring home.She was given IV ativan in ER and felt better.She has been stressed at work.  Review of Systems See HPI Reviewed past medical,surgical, social and family history. Reviewed medications and allergies.     Objective:   Physical Exam BP 122/84  Ht 5\' 5"  (1.651 m)  Wt 205 lb (92.987 kg)  BMI 34.11 kg/m2   Doing OK today,worked 1/2 day, discussed taking a medication every day and she agrees.  Assessment:     Anxiety    Plan:    Try relaxation techniques Rx lexapro 10 mg disp #30 1 daily with 3 refills Follow up in 4 weeks Request records from ER visit 5/25 in [place] Review handout on anxiety   Call if happens again

## 2014-03-08 ENCOUNTER — Telehealth: Payer: Self-pay | Admitting: *Deleted

## 2014-03-08 NOTE — Telephone Encounter (Signed)
Margaret [name] called saying she had panic attack this am about 4 and she took 1/2 xanax and was better and had another at school this am and had to take the other 1/2 and is OK now, she started the lexapro last night, told her to continue using the xanax as needed either 1/2 or 1 every 8 hours.

## 2014-03-28 ENCOUNTER — Telehealth: Payer: Self-pay | Admitting: Adult Health

## 2014-03-28 MED ORDER — ALPRAZOLAM 0.5 MG PO TABS: 0.5000 mg | ORAL_TABLET | Freq: Every evening | ORAL | Status: DC | PRN

## 2014-03-28 NOTE — Telephone Encounter (Signed)
Will fax rx for xanax to [facility]

## 2014-04-12 ENCOUNTER — Encounter: Payer: Self-pay | Admitting: Adult Health

## 2014-04-12 ENCOUNTER — Ambulatory Visit (INDEPENDENT_AMBULATORY_CARE_PROVIDER_SITE_OTHER): Payer: BC Managed Care – PPO | Admitting: Adult Health

## 2014-04-12 VITALS — BP 110/82 | Ht 65.0 in | Wt 201.0 lb

## 2014-04-12 DIAGNOSIS — F419 Anxiety disorder, unspecified: Secondary | ICD-10-CM

## 2014-04-12 DIAGNOSIS — R0789 Other chest pain: Secondary | ICD-10-CM

## 2014-04-12 DIAGNOSIS — F411 Generalized anxiety disorder: Secondary | ICD-10-CM

## 2014-04-12 NOTE — Progress Notes (Signed)
Subjective:     Patient ID: Margaret [name], female   DOB: May 07, 1971, 43 y.o.   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   Review of Systems See HPI Reviewed past medical,surgical, social and family history. Reviewed medications and allergies.     Objective:   Physical Exam BP 110/82  Ht 5\' 5"  (1.651 m)  Wt 201 lb (91.173 kg)  BMI 33.45 kg/m2   Doing good but had tightness in chest and pain and short of breath at graduation recently, but is feeling better and if take xanax feels better within 30 minutes,reviewed labs,EKG and chest xray from her ER visit several weeks ago.will refer to cardiologists to just make sure tightness and short of breath is anxiety and not related to heart. She is moving in her new office today and seems happy.  Assessment:     Anxiety    Plan:    Call prn problems or concerns Continue current meds Follow up with me prn Refer to cardiologist for consult

## 2014-04-12 NOTE — Patient Instructions (Signed)
refer to cardiologist Follow up prn

## 2014-06-06 ENCOUNTER — Encounter: Payer: Self-pay | Admitting: *Deleted

## 2014-06-06 ENCOUNTER — Encounter: Payer: Self-pay | Admitting: Cardiology

## 2014-06-06 ENCOUNTER — Ambulatory Visit (INDEPENDENT_AMBULATORY_CARE_PROVIDER_SITE_OTHER): Payer: BC Managed Care – PPO | Admitting: Cardiology

## 2014-06-06 VITALS — BP 130/93 | HR 83 | Ht 65.0 in | Wt 208.0 lb

## 2014-06-06 DIAGNOSIS — R079 Chest pain, unspecified: Secondary | ICD-10-CM

## 2014-06-06 NOTE — Patient Instructions (Signed)
Your physician has requested that you have an exercise tolerance test. For further information please visit [email or web address]. Please also follow instruction sheet, as given. Office will contact with results via phone or letter.   Continue all current medications. Follow up pending test results

## 2014-06-06 NOTE — Progress Notes (Signed)
Clinical Summary Margaret [name] is a 43 y.o.female seen today as a new patient for the following medical problems.  1. Chest pain - reports severe anxiety attack/chest pain in May, 2015. Seen in hospital there, diagnosed with anxiety attack and discharged from ER - has had tightness in chest on and off since March. Diffusely throughout chest, 5-7/10. Can occur often at rest, often occurs in car while driving. Not exertional. No other symptoms. Not positional. Tightness lasts 2-3 minutes. Occurs approx 2-3 times a week. Ongoing since March, decrease in frequency, stable severity - denies any DOE. Walks 2-3 times a week in neihborhood x 30 minutes. Occasional left leg swelling ever since leg fracture.  No orthopnea  CAD risk factors: Former tobacco x 20 years, quit 2 years ago. Father CHF early 67s.   Past Medical History  Diagnosis Date  . Complication of anesthesia     Severe itching after spinal (C-Section)  . Hemorrhoids   . Obesity   . Anxiety 03/07/2014     No Known Allergies   Current Outpatient Prescriptions  Medication Sig Dispense Refill  . ALPRAZolam (XANAX) 0.5 MG tablet Take 1 tablet (0.5 mg total) by mouth at bedtime as needed for anxiety.  30 tablet  2  . cholecalciferol (VITAMIN D) 1000 UNITS tablet Take 1,000 Units by mouth daily.      Marland Kitchen escitalopram (LEXAPRO) 10 MG tablet Take 1 tablet (10 mg total) by mouth daily.  30 tablet  3  . fish oil-omega-3 fatty acids 1000 MG capsule Take 1 g by mouth daily.      . Multiple Vitamin (MULTIVITAMIN) tablet Take 1 tablet by mouth daily.       No current facility-administered medications for this visit.     Past Surgical History  Procedure Laterality Date  . Cesarean section    . Ankle fracture surgery    . Orif ankle fracture  03/11/2012    Procedure: OPEN REDUCTION INTERNAL FIXATION (ORIF) ANKLE FRACTURE;  Surgeon: Margaret Civil, MD;  Location: [facility];  Service: Orthopedics;  Laterality: Left;  . Colonoscopy        No Known Allergies    Family History  Problem Relation Age of Onset  . Anesthesia problems Neg Hx   . Hypotension Neg Hx   . Malignant hyperthermia Neg Hx   . Pseudochol deficiency Neg Hx   . Hyperlipidemia Mother   . Heart disease Father   . Diabetes Father   . Kidney disease Father   . Cancer Maternal Aunt 55    breast   . Cancer Maternal Grandmother     breast  . Heart disease Maternal Grandmother   . Cancer Maternal Grandfather     bone  . Cancer Paternal Grandmother   . Cancer Paternal Grandfather      Social History Margaret [name] reports that she quit smoking about 2 years ago. Her smoking use included Cigarettes. She has a 11 pack-year smoking history. She has never used smokeless tobacco. Margaret [name] reports that she drinks about 1.8 ounces of alcohol per week.   Review of Systems CONSTITUTIONAL: No weight loss, fever, chills, weakness or fatigue.  HEENT: Eyes: No visual loss, blurred vision, double vision or yellow sclerae.No hearing loss, sneezing, congestion, runny nose or sore throat.  SKIN: No rash or itching.  CARDIOVASCULAR: per HPI RESPIRATORY: No shortness of breath, cough or sputum.  GASTROINTESTINAL: No anorexia, nausea, vomiting or diarrhea. No abdominal pain or blood.  GENITOURINARY: No burning  on urination, no polyuria NEUROLOGICAL: No headache, dizziness, syncope, paralysis, ataxia, numbness or tingling in the extremities. No change in bowel or bladder control.  MUSCULOSKELETAL: No muscle, back pain, joint pain or stiffness.  LYMPHATICS: No enlarged nodes. No history of splenectomy.  PSYCHIATRIC: + anxiety.  ENDOCRINOLOGIC: No reports of sweating, cold or heat intolerance. No polyuria or polydipsia.  Marland Kitchen   Physical Examination p 83 bp 130/93 Wt 208 lbs BMI 35 Gen: resting comfortably, no acute distress HEENT: no scleral icterus, pupils equal round and reactive, no palptable cervical adenopathy,  CV: RRR, no m/r/g, no JVD, no carotid  bruits Resp: Clear to auscultation bilaterally GI: abdomen is soft, non-tender, non-distended, normal bowel sounds, no hepatosplenomegaly MSK: extremities are warm, no edema.  Skin: warm, no rash Neuro:  no focal deficits Psych: appropriate affect   Diagnostic Studies EKG NSR    Assessment and Plan  1. Chest pain - unclear etiology, potentially could be stress related as there does appear to be some association - long time smoker, does have some family history of heart disease - obtain GXT to evaluate for ischemia and further risk stratify.    F/u pending stress test results   Arnoldo Lenis, M.D., F.A.C.C.

## 2014-06-14 ENCOUNTER — Encounter (HOSPITAL_COMMUNITY): Payer: Self-pay

## 2014-06-14 ENCOUNTER — Ambulatory Visit (HOSPITAL_COMMUNITY)
Admission: RE | Admit: 2014-06-14 | Discharge: 2014-06-14 | Disposition: A | Discharge status: Home / Self Care | Payer: BC Managed Care – PPO | Source: Ambulatory Visit | Attending: Cardiology | Admitting: Cardiology

## 2014-06-14 DIAGNOSIS — R079 Chest pain, unspecified: Secondary | ICD-10-CM | POA: Diagnosis present

## 2014-06-14 DIAGNOSIS — R5381 Other malaise: Secondary | ICD-10-CM | POA: Insufficient documentation

## 2014-06-14 DIAGNOSIS — R5383 Other fatigue: Secondary | ICD-10-CM

## 2014-06-14 DIAGNOSIS — R9439 Abnormal result of other cardiovascular function study: Secondary | ICD-10-CM | POA: Diagnosis not present

## 2014-06-14 NOTE — Progress Notes (Addendum)
Stress Lab Nurses Notes - Margaret [name]  Margaret [name] 06/14/2014 Reason for doing test: Chest Pain Type of test: Regular GTX Nurse performing test: Margaret Halls, RN Nuclear Medicine Tech: Not Applicable Echo Tech: Not Applicable MD performing test: Margaret Nails NP Family MD: Margaret Monaco NP Test explained and consent signed: Yes.   IV started: No IV started Symptoms: Fatigue Treatment/Intervention: None Reason test stopped: fatigue After recovery IV was: [name] Patient to return to Nuc. Med at : [name] Patient discharged: Home Patient's Condition upon discharge was: stable Comments: During test peak BP 157/117 & HR 173.  Recovery BP 127/79 & HR 110. Symptoms resolved in recovery. Margaret [name]  ATTENDING PHYSICIAN: Pt had good exercise tolerance. With exercise, there were mild nonspecific rate-related ST segment changes. In recovery, there was a nonspecific [name] wave abnormality more prominent in the inferior leads, but also noted with in the lateral leads accompanied by a nonspecific ST segment abnormality. No arrhythmias were noted, and no chest pain was reported. This represents a low risk exercise treadmill stress test (Duke treadmill score 5). Consider noninvasive imaging if deemed clinically appropriate.

## 2014-07-04 ENCOUNTER — Other Ambulatory Visit: Payer: Self-pay | Admitting: Adult Health

## 2014-08-14 ENCOUNTER — Encounter: Payer: Self-pay | Admitting: Cardiology

## 2014-09-11 ENCOUNTER — Other Ambulatory Visit: Payer: Self-pay

## 2014-09-11 DIAGNOSIS — Z1231 Encounter for screening mammogram for malignant neoplasm of breast: Secondary | ICD-10-CM

## 2014-09-29 ENCOUNTER — Ambulatory Visit
Admission: RE | Admit: 2014-09-29 | Discharge: 2014-09-29 | Disposition: A | Discharge status: Home / Self Care | Payer: BC Managed Care – PPO | Source: Ambulatory Visit

## 2014-09-29 DIAGNOSIS — Z1231 Encounter for screening mammogram for malignant neoplasm of breast: Secondary | ICD-10-CM

## 2015-02-05 ENCOUNTER — Other Ambulatory Visit: Payer: Self-pay | Admitting: Adult Health

## 2015-02-06 MED ORDER — ALPRAZOLAM 0.5 MG PO TABS: 0.5000 mg | ORAL_TABLET | Freq: Every evening | ORAL | Status: DC | PRN

## 2015-02-12 ENCOUNTER — Other Ambulatory Visit: Payer: BC Managed Care – PPO

## 2015-02-12 DIAGNOSIS — Z13220 Encounter for screening for lipoid disorders: Secondary | ICD-10-CM

## 2015-02-12 DIAGNOSIS — Z0189 Encounter for other specified special examinations: Secondary | ICD-10-CM

## 2015-02-12 DIAGNOSIS — Z1329 Encounter for screening for other suspected endocrine disorder: Secondary | ICD-10-CM

## 2015-02-13 LAB — COMPREHENSIVE METABOLIC PANEL
A/G RATIO: 2 (ref 1.1–2.5)
ALT: 25 IU/L (ref 0–32)
AST: 21 IU/L (ref 0–40)
Albumin: 4.5 g/dL (ref 3.5–5.5)
Alkaline Phosphatase: 64 IU/L (ref 39–117)
BUN/Creatinine Ratio: 15 (ref 9–23)
BUN: 10 mg/dL (ref 6–24)
Bilirubin Total: 0.7 mg/dL (ref 0.0–1.2)
CALCIUM: 9.4 mg/dL (ref 8.7–10.2)
CO2: 22 mmol/L (ref 18–29)
CREATININE: 0.65 mg/dL (ref 0.57–1.00)
Chloride: 98 mmol/L (ref 97–108)
GFR, EST AFRICAN AMERICAN: 126 mL/min/{1.73_m2} (ref >59)
GFR, EST NON AFRICAN AMERICAN: 109 mL/min/{1.73_m2} (ref >59)
GLOBULIN, TOTAL: 2.3 g/dL (ref 1.5–4.5)
GLUCOSE: 112 mg/dL — AB (ref 65–99)
Potassium: 4.9 mmol/L (ref 3.5–5.2)
Sodium: 137 mmol/L (ref 134–144)
TOTAL PROTEIN: 6.8 g/dL (ref 6.0–8.5)

## 2015-02-13 LAB — CBC
Hematocrit: 43.8 % (ref 34.0–46.6)
Hemoglobin: 14.5 g/dL (ref 11.1–15.9)
MCH: 30.9 pg (ref 26.6–33.0)
MCHC: 33.1 g/dL (ref 31.5–35.7)
MCV: 93 fL (ref 79–97)
PLATELETS: 301 10*3/uL (ref 150–379)
RBC: 4.69 x10E6/uL (ref 3.77–5.28)
RDW: 13.3 % (ref 12.3–15.4)
WBC: 8.3 10*3/uL (ref 3.4–10.8)

## 2015-02-13 LAB — LIPID PANEL
Chol/HDL Ratio: 4.7 ratio units — ABNORMAL HIGH (ref 0.0–4.4)
Cholesterol, Total: 224 mg/dL — ABNORMAL HIGH (ref 100–199)
HDL: 48 mg/dL (ref >39)
LDL CALC: 138 mg/dL — AB (ref 0–99)
Triglycerides: 191 mg/dL — ABNORMAL HIGH (ref 0–149)
VLDL Cholesterol Cal: 38 mg/dL (ref 5–40)

## 2015-02-13 LAB — TSH: TSH: 1.33 u[IU]/mL (ref 0.450–4.500)

## 2015-02-15 ENCOUNTER — Encounter: Payer: Self-pay | Admitting: Adult Health

## 2015-02-15 ENCOUNTER — Ambulatory Visit (INDEPENDENT_AMBULATORY_CARE_PROVIDER_SITE_OTHER): Payer: BC Managed Care – PPO | Admitting: Adult Health

## 2015-02-15 VITALS — BP 118/70 | HR 80 | Ht 64.0 in | Wt 218.0 lb

## 2015-02-15 DIAGNOSIS — Z1212 Encounter for screening for malignant neoplasm of rectum: Secondary | ICD-10-CM

## 2015-02-15 DIAGNOSIS — F419 Anxiety disorder, unspecified: Secondary | ICD-10-CM

## 2015-02-15 DIAGNOSIS — R739 Hyperglycemia, unspecified: Secondary | ICD-10-CM

## 2015-02-15 DIAGNOSIS — Z01419 Encounter for gynecological examination (general) (routine) without abnormal findings: Secondary | ICD-10-CM

## 2015-02-15 DIAGNOSIS — E7800 Pure hypercholesterolemia, unspecified: Secondary | ICD-10-CM

## 2015-02-15 DIAGNOSIS — N951 Menopausal and female climacteric states: Secondary | ICD-10-CM

## 2015-02-15 DIAGNOSIS — R232 Flushing: Secondary | ICD-10-CM

## 2015-02-15 HISTORY — DX: Menopausal and female climacteric states: N95.1

## 2015-02-15 HISTORY — DX: Flushing: R23.2

## 2015-02-15 LAB — HEMOCCULT GUIAC POC 1CARD (OFFICE): Fecal Occult Blood, POC: NEGATIVE

## 2015-02-15 LAB — SPECIMEN STATUS REPORT

## 2015-02-15 NOTE — Progress Notes (Signed)
Patient ID: Margaret [name], female   DOB: 04/20/1971, 44 y.o.   MRN: [id number] History of Present Illness:  Margaret [name] is a 44 year old white female, married in for well woman gyn exam.She had a normal pap with negative HPV 11/09/12.She is complaining of weight gain and hot flashes, she is sp ablation.  Current Medications, Allergies, Past Medical History, Past Surgical History, Family History and Social History were reviewed in [facility] record.     Review of Systems: Patient denies any headaches, hearing loss, fatigue, blurred vision, shortness of breath, chest pain, abdominal pain, problems with bowel movements, urination, or intercourse. No joint pain or mood swings.Has new job and not stressed as before. Had negative stress test last year.   Physical Exam:BP 118/70 mmHg  Pulse 80  Ht 5\' 4"  (1.626 m)  Wt 218 lb (98.884 kg)  BMI 37.40 kg/m2 General:  Well developed, well nourished, no acute distress Skin:  Warm and dry Neck:  Midline trachea, normal thyroid, good ROM, no lymphadenopathy Lungs; Clear to auscultation bilaterally Breast:  No dominant palpable mass, retraction, or nipple discharge Cardiovascular: Regular rate and rhythm Abdomen:  Soft, non tender, no hepatosplenomegaly Pelvic:  External genitalia is normal in appearance, no lesions.  The vagina is normal in appearance. Urethra has no lesions or masses. The cervix is bulbous.  Uterus is felt to be normal size, shape, and contour.  No adnexal masses or tenderness noted.Bladder is non tender, no masses felt. Rectal: Good sphincter tone, no polyps, internal hemorrhoids felt.  Hemoccult negative. Had colonoscopy last for bleeding hemorrhoids. Extremities/musculoskeletal:  No swelling or varicosities noted, no clubbing or cyanosis Psych:  No mood changes, alert and cooperative,seems happy Reviewed labs with pt, cholesterol elevated and BS 112, will suggest Whole 30 and keep doing Zumba 3 days a week.Will check A1c  today and repeat labs in 3 months.Discussed possible HRT if 2 lexapro does not help.  Impression: Well woman gyn exam no pap Anxiety Elevated blood sugar Elevated cholesterol Hot flashes Perimenopause    Plan: Can take 2 lexapro to see if helps hot flashes for now Check A1c today Return in 3 months for CMP,Lipids and A1c Try Whole 30 Pap and physical in 1 year Mammogram yearly Continue lexapro as refills Review handout on perimenopause

## 2015-02-15 NOTE — Patient Instructions (Addendum)
Mammogram yearly Pap and physical  In 1 year Return in 3 months for fasting labs TRY WHOLE 30 Perimenopause Perimenopause is the time when your body begins to move into the menopause (no menstrual period for 12 straight months). It is a natural process. Perimenopause can begin 2-8 years before the menopause and usually lasts for 1 year after the menopause. During this time, your ovaries may or may not produce an egg. The ovaries vary in their production of estrogen and progesterone hormones each month. This can cause irregular menstrual periods, difficulty getting pregnant, vaginal bleeding between periods, and uncomfortable symptoms. CAUSES  Irregular production of the ovarian hormones, estrogen and progesterone, and not ovulating every month.  Other causes include:  Tumor of the pituitary gland in the brain.  Medical disease that affects the ovaries.  Radiation treatment.  Chemotherapy.  Unknown causes.  Heavy smoking and excessive alcohol intake can bring on perimenopause sooner. SIGNS AND SYMPTOMS   Hot flashes.  Night sweats.  Irregular menstrual periods.  Decreased sex drive.  Vaginal dryness.  Headaches.  Mood swings.  Depression.  Memory problems.  Irritability.  Tiredness.  Weight gain.  Trouble getting pregnant.  The beginning of losing bone cells (osteoporosis).  The beginning of hardening of the arteries (atherosclerosis). DIAGNOSIS  Your health care provider will make a diagnosis by analyzing your age, menstrual history, and symptoms. He or she will do a physical exam and note any changes in your body, especially your female organs. Female hormone tests may or may not be helpful depending on the amount of female hormones you produce and when you produce them. However, other hormone tests may be helpful to rule out other problems. TREATMENT  In some cases, no treatment is needed. The decision on whether treatment is necessary during the  perimenopause should be made by you and your health care provider based on how the symptoms are affecting you and your lifestyle. Various treatments are available, such as:  Treating individual symptoms with a specific medicine for that symptom.  Herbal medicines that can help specific symptoms.  Counseling.  Group therapy. HOME CARE INSTRUCTIONS   Keep track of your menstrual periods (when they occur, how heavy they are, how long between periods, and how long they last) as well as your symptoms and when they started.  Only take over-the-counter or prescription medicines as directed by your health care provider.  Sleep and rest.  Exercise.  Eat a diet that contains calcium (good for your bones) and soy (acts like the estrogen hormone).  Do not smoke.  Avoid alcoholic beverages.  Take vitamin supplements as recommended by your health care provider. Taking vitamin E may help in certain cases.  Take calcium and vitamin D supplements to help prevent bone loss.  Group therapy is sometimes helpful.  Acupuncture may help in some cases. SEEK MEDICAL CARE IF:   You have questions about any symptoms you are having.  You need a referral to a specialist (gynecologist, psychiatrist, or psychologist). SEEK IMMEDIATE MEDICAL CARE IF:   You have vaginal bleeding.  Your period lasts longer than 8 days.  Your periods are recurring sooner than 21 days.  You have bleeding after intercourse.  You have severe depression.  You have pain when you urinate.  You have severe headaches.  You have vision problems. Document Released: 11/06/2004 Document Revised: 07/20/2013 Document Reviewed: 04/28/2013 [facility] Patient Information 2015 [place], [place]. This information is not intended to replace advice given to you by your health care  provider. Make sure you discuss any questions you have with your health care provider.

## 2015-02-16 ENCOUNTER — Telehealth: Payer: Self-pay | Admitting: Adult Health

## 2015-02-16 LAB — HGB A1C W/O EAG: Hgb A1c MFr Bld: 6.1 % — ABNORMAL HIGH (ref 4.8–5.6)

## 2015-02-16 LAB — SPECIMEN STATUS REPORT

## 2015-02-16 NOTE — Telephone Encounter (Signed)
Left message A1c 6.1 at increased risk for diabetes will recheck in 3 months

## 2015-05-09 ENCOUNTER — Telehealth: Payer: Self-pay | Admitting: *Deleted

## 2015-05-09 DIAGNOSIS — Z8639 Personal history of other endocrine, nutritional and metabolic disease: Secondary | ICD-10-CM

## 2015-05-09 DIAGNOSIS — R7309 Other abnormal glucose: Secondary | ICD-10-CM

## 2015-05-09 NOTE — Telephone Encounter (Signed)
Pt states saw Derrek Monaco, NP on 02/15/2015 and was to return for blood work and see Derrek Monaco, NP in 3 months from that appt. Pt states she would like to have the lab work done before her appt with Anderson Malta so the results can be reviewed at her appt time with Anderson Malta on 05/21/2015. Pt informed orders place pt can go to [facility] a few days prior to her appt with Anderson Malta. Pt verbalized understanding.

## 2015-05-17 LAB — HEMOGLOBIN A1C
ESTIMATED AVERAGE GLUCOSE: 128 mg/dL
HEMOGLOBIN A1C: 6.1 % — AB (ref 4.8–5.6)

## 2015-05-17 LAB — COMPREHENSIVE METABOLIC PANEL
ALT: 28 IU/L (ref 0–32)
AST: 23 IU/L (ref 0–40)
Albumin/Globulin Ratio: 1.6 (ref 1.1–2.5)
Albumin: 4.1 g/dL (ref 3.5–5.5)
Alkaline Phosphatase: 59 IU/L (ref 39–117)
BUN / CREAT RATIO: 15 (ref 9–23)
BUN: 11 mg/dL (ref 6–24)
Bilirubin Total: 0.3 mg/dL (ref 0.0–1.2)
CO2: 22 mmol/L (ref 18–29)
Calcium: 9.7 mg/dL (ref 8.7–10.2)
Chloride: 101 mmol/L (ref 97–108)
Creatinine, Ser: 0.71 mg/dL (ref 0.57–1.00)
GFR calc Af Amer: 120 mL/min/{1.73_m2} (ref >59)
GFR calc non Af Amer: 104 mL/min/{1.73_m2} (ref >59)
GLOBULIN, TOTAL: 2.5 g/dL (ref 1.5–4.5)
Glucose: 122 mg/dL — ABNORMAL HIGH (ref 65–99)
POTASSIUM: 5.2 mmol/L (ref 3.5–5.2)
Sodium: 138 mmol/L (ref 134–144)
Total Protein: 6.6 g/dL (ref 6.0–8.5)

## 2015-05-17 LAB — LIPID PANEL
CHOL/HDL RATIO: 5.2 ratio — AB (ref 0.0–4.4)
Cholesterol, Total: 207 mg/dL — ABNORMAL HIGH (ref 100–199)
HDL: 40 mg/dL (ref >39)
LDL Calculated: 145 mg/dL — ABNORMAL HIGH (ref 0–99)
Triglycerides: 110 mg/dL (ref 0–149)
VLDL Cholesterol Cal: 22 mg/dL (ref 5–40)

## 2015-05-21 ENCOUNTER — Ambulatory Visit (INDEPENDENT_AMBULATORY_CARE_PROVIDER_SITE_OTHER): Payer: BC Managed Care – PPO | Admitting: Adult Health

## 2015-05-21 ENCOUNTER — Encounter: Payer: Self-pay | Admitting: Adult Health

## 2015-05-21 VITALS — BP 114/72 | HR 76 | Ht 64.5 in | Wt 216.0 lb

## 2015-05-21 DIAGNOSIS — R7309 Other abnormal glucose: Secondary | ICD-10-CM

## 2015-05-21 DIAGNOSIS — E785 Hyperlipidemia, unspecified: Secondary | ICD-10-CM | POA: Diagnosis not present

## 2015-05-21 DIAGNOSIS — R739 Hyperglycemia, unspecified: Secondary | ICD-10-CM

## 2015-05-21 HISTORY — DX: Hyperglycemia, unspecified: R73.9

## 2015-05-21 HISTORY — DX: Hyperlipidemia, unspecified: E78.5

## 2015-05-21 NOTE — Progress Notes (Signed)
Subjective:     Patient ID: Margaret [name], female   DOB: 31-Aug-1971, 44 y.o.   MRN: [id number]  HPI Margaret [name] is a 44 year old white female, married back in follow up of having elevated blood sugar and cholesterol and triglycerides, she says she is eating better, no sodas and is doing Zumba  3-4 x a week for 1 hour and loves it.She had labs last week. She is putting house on market to downsize and daughter is Paramedic at [employer].  Review of Systems Patient denies any headaches, hearing loss, fatigue, blurred vision, shortness of breath, chest pain, abdominal pain, problems with bowel movements, urination, or intercourse. No joint pain or mood swings. Reviewed past medical,surgical, social and family history. Reviewed medications and allergies.     Objective:   Physical Exam BP 114/72 mmHg  Pulse 76  Ht 5' 4.5" (1.638 m)  Wt 216 lb (97.977 kg)  BMI 36.52 kg/m2She has lost 2 lbs, Skin warm and dry. Neck: mid line trachea, normal thyroid, good ROM, no lymphadenopathy noted. Lungs: clear to ausculation bilaterally. Cardiovascular: regular rate and rhythm.Reviewed labs, BS 122, A1c 6.1 and cholesterol and triglycerides have decreased but so did HDL.Will continue diet and exercise and will repeat labs in 3 months.    Assessment:     Elevated blood sugar Dyslipidemia      Plan:     Continue low carbs and exercise Follow up in 3 months, will check A1c and CMP and lipids before appt.

## 2015-08-16 ENCOUNTER — Other Ambulatory Visit: Payer: BC Managed Care – PPO

## 2015-08-16 DIAGNOSIS — E785 Hyperlipidemia, unspecified: Secondary | ICD-10-CM

## 2015-08-16 DIAGNOSIS — R7309 Other abnormal glucose: Secondary | ICD-10-CM

## 2015-08-16 NOTE — Addendum Note (Signed)
Addended by: Doyne Keel on: 08/16/2015 08:48 AM   Modules accepted: Orders

## 2015-08-17 LAB — HEMOGLOBIN A1C
Est. average glucose Bld gHb Est-mCnc: 120 mg/dL
Hgb A1c MFr Bld: 5.8 % — ABNORMAL HIGH (ref 4.8–5.6)

## 2015-08-17 LAB — LIPID PANEL
CHOL/HDL RATIO: 4.8 ratio — AB (ref 0.0–4.4)
Cholesterol, Total: 222 mg/dL — ABNORMAL HIGH (ref 100–199)
HDL: 46 mg/dL (ref >39)
LDL CALC: 138 mg/dL — AB (ref 0–99)
TRIGLYCERIDES: 189 mg/dL — AB (ref 0–149)
VLDL Cholesterol Cal: 38 mg/dL (ref 5–40)

## 2015-08-20 ENCOUNTER — Telehealth: Payer: Self-pay | Admitting: Adult Health

## 2015-08-20 NOTE — Telephone Encounter (Signed)
Left message with labs results,repeat in 3 -6 months

## 2015-08-21 ENCOUNTER — Encounter: Payer: Self-pay | Admitting: Adult Health

## 2015-08-21 ENCOUNTER — Ambulatory Visit (INDEPENDENT_AMBULATORY_CARE_PROVIDER_SITE_OTHER): Payer: BC Managed Care – PPO | Admitting: Adult Health

## 2015-08-21 VITALS — BP 122/80 | HR 94 | Ht 63.0 in | Wt 222.5 lb

## 2015-08-21 DIAGNOSIS — E785 Hyperlipidemia, unspecified: Secondary | ICD-10-CM

## 2015-08-21 DIAGNOSIS — F419 Anxiety disorder, unspecified: Secondary | ICD-10-CM

## 2015-08-21 MED ORDER — ALPRAZOLAM 0.5 MG PO TABS: 0.5000 mg | ORAL_TABLET | Freq: Every evening | ORAL | Status: DC | PRN

## 2015-08-21 NOTE — Patient Instructions (Signed)
Try flax seed and red yeast rice Labs in 3 months

## 2015-08-21 NOTE — Progress Notes (Signed)
Subjective:     Patient ID: Margaret [name], female   DOB: [birth year], 44 y.o.   MRN: [id number]  HPI Margaret [name] is a 44 year old white female in for weight check and review labs, and she requests refill on xanax,her Rx has expired.She uses for anxiety at times.  Review of Systems Patient denies any headaches, hearing loss, fatigue, blurred vision, shortness of breath, chest pain, abdominal pain, problems with bowel movements, urination, or intercourse. No joint pain or mood swings. Reviewed past medical,surgical, social and family history. Reviewed medications and allergies.     Objective:   Physical Exam BP 122/80 mmHg  Pulse 94  Ht 5\' 3"  (1.6 m)  Wt 222 lb 8 oz (100.925 kg)  BMI 39.42 kg/m2 Skin warm and dry.  Lungs: clear to ausculation bilaterally. Cardiovascular: regular rate and rhythm.Reviewed labs with pt, A1c was better at 5.8 and cholesterol was better but triglycerides were up,she actually gained 6.5 lbs since August but is feeling better and clothes are looser.    Assessment:     Dyslipidemia Anxiety     Plan:    Continue weight loss efforts and exercise,try red yeast rice and flax seed Refilled xanax 0.5 mg #30 take 1 at HS prn anxiety with 2 refills   Return in 3 months for CMP,lipids and A1c

## 2015-11-16 ENCOUNTER — Other Ambulatory Visit: Payer: Self-pay | Admitting: Adult Health

## 2015-11-16 ENCOUNTER — Telehealth: Payer: Self-pay | Admitting: Adult Health

## 2015-11-16 DIAGNOSIS — R739 Hyperglycemia, unspecified: Secondary | ICD-10-CM

## 2015-11-16 DIAGNOSIS — E785 Hyperlipidemia, unspecified: Secondary | ICD-10-CM

## 2015-11-16 NOTE — Telephone Encounter (Signed)
Will get labs today CMP,lipids and A1c

## 2015-11-17 LAB — LIPID PANEL
CHOL/HDL RATIO: 5.2 ratio — AB (ref 0.0–4.4)
CHOLESTEROL TOTAL: 201 mg/dL — AB (ref 100–199)
HDL: 39 mg/dL — ABNORMAL LOW (ref >39)
LDL CALC: 134 mg/dL — AB (ref 0–99)
Triglycerides: 139 mg/dL (ref 0–149)
VLDL Cholesterol Cal: 28 mg/dL (ref 5–40)

## 2015-11-17 LAB — COMPREHENSIVE METABOLIC PANEL
ALBUMIN: 4.6 g/dL (ref 3.5–5.5)
ALK PHOS: 65 IU/L (ref 39–117)
ALT: 29 IU/L (ref 0–32)
AST: 30 IU/L (ref 0–40)
Albumin/Globulin Ratio: 2 (ref 1.1–2.5)
BUN/Creatinine Ratio: 11 (ref 9–23)
BUN: 8 mg/dL (ref 6–24)
Bilirubin Total: 0.8 mg/dL (ref 0.0–1.2)
CO2: 21 mmol/L (ref 18–29)
Calcium: 9.6 mg/dL (ref 8.7–10.2)
Chloride: 102 mmol/L (ref 96–106)
Creatinine, Ser: 0.74 mg/dL (ref 0.57–1.00)
GFR calc Af Amer: 114 mL/min/{1.73_m2} (ref >59)
GFR, EST NON AFRICAN AMERICAN: 99 mL/min/{1.73_m2} (ref >59)
GLOBULIN, TOTAL: 2.3 g/dL (ref 1.5–4.5)
Glucose: 110 mg/dL — ABNORMAL HIGH (ref 65–99)
POTASSIUM: 4.7 mmol/L (ref 3.5–5.2)
SODIUM: 141 mmol/L (ref 134–144)
Total Protein: 6.9 g/dL (ref 6.0–8.5)

## 2015-11-17 LAB — HEMOGLOBIN A1C
ESTIMATED AVERAGE GLUCOSE: 123 mg/dL
Hgb A1c MFr Bld: 5.9 % — ABNORMAL HIGH (ref 4.8–5.6)

## 2015-11-23 ENCOUNTER — Other Ambulatory Visit: Payer: BC Managed Care – PPO

## 2015-11-23 ENCOUNTER — Ambulatory Visit (INDEPENDENT_AMBULATORY_CARE_PROVIDER_SITE_OTHER): Payer: BC Managed Care – PPO | Admitting: Adult Health

## 2015-11-23 ENCOUNTER — Encounter: Payer: Self-pay | Admitting: Adult Health

## 2015-11-23 ENCOUNTER — Other Ambulatory Visit: Payer: Self-pay

## 2015-11-23 VITALS — BP 124/80 | HR 86 | Ht 64.0 in | Wt 212.5 lb

## 2015-11-23 DIAGNOSIS — R7309 Other abnormal glucose: Secondary | ICD-10-CM | POA: Diagnosis not present

## 2015-11-23 DIAGNOSIS — F419 Anxiety disorder, unspecified: Secondary | ICD-10-CM

## 2015-11-23 DIAGNOSIS — R739 Hyperglycemia, unspecified: Secondary | ICD-10-CM

## 2015-11-23 DIAGNOSIS — Z1231 Encounter for screening mammogram for malignant neoplasm of breast: Secondary | ICD-10-CM

## 2015-11-23 DIAGNOSIS — E7800 Pure hypercholesterolemia, unspecified: Secondary | ICD-10-CM

## 2015-11-23 DIAGNOSIS — L0292 Furuncle, unspecified: Secondary | ICD-10-CM

## 2015-11-23 HISTORY — DX: Furuncle, unspecified: L02.92

## 2015-11-23 MED ORDER — ESCITALOPRAM OXALATE 10 MG PO TABS: 10.0000 mg | ORAL_TABLET | Freq: Every day | ORAL | Status: DC

## 2015-11-23 MED ORDER — SULFAMETHOXAZOLE-TRIMETHOPRIM 800-160 MG PO TABS: 1.0000 | ORAL_TABLET | Freq: Two times a day (BID) | ORAL | Status: DC

## 2015-11-23 NOTE — Patient Instructions (Signed)
Increase exercise Follow up in 3 months pap and physical and labs Get mammogram

## 2015-11-23 NOTE — Progress Notes (Signed)
Subjective:     Patient ID: Margaret [name], female   DOB: [birth year], 45 y.o.   MRN: [id number]  HPI Margaret [name] is a 45 year old white female to review labs and refill lexapro and has boil in groin.She is building new house, is down sizing.  Review of Systems Patient denies any headaches, hearing loss, fatigue, blurred vision, shortness of breath, chest pain, abdominal pain, problems with bowel movements, urination, or intercourse. No joint pain or mood swings.See HPI for positives  Reviewed past medical,surgical, social and family history. Reviewed medications and allergies.     Objective:   Physical Exam BP 124/80 mmHg  Pulse 86  Ht 5\' 4"  (1.626 m)  Wt 212 lb 8 oz (96.389 kg)  BMI 36.46 kg/m2   Skin warm and dry, has boil right groin, seems chronic like, reviewed labs with pt, A1C 5.9 and cholesterol panel but but HDL dropped, encouraged to keep up weight loss has lost 10 lbs in 3 months and increase exercise and eat more fish. Face time 15 minutes with 50% counseling.  Assessment:     Boil Anxiety Elevated BS Elevated cholesterol     Plan:    Rx septra ds 1 bid x 14 days #28 with 1 refill  Refilled lexapro 10 mg #30 with 6 refills Increase exercise Follow up in 3 months for pap and physical and labs Get mammogram Has refill on xanax

## 2015-12-14 ENCOUNTER — Ambulatory Visit
Admission: RE | Admit: 2015-12-14 | Discharge: 2015-12-14 | Disposition: A | Discharge status: Home / Self Care | Payer: BC Managed Care – PPO | Source: Ambulatory Visit

## 2015-12-14 DIAGNOSIS — Z1231 Encounter for screening mammogram for malignant neoplasm of breast: Secondary | ICD-10-CM

## 2016-02-29 ENCOUNTER — Other Ambulatory Visit: Payer: BC Managed Care – PPO

## 2016-02-29 DIAGNOSIS — R739 Hyperglycemia, unspecified: Secondary | ICD-10-CM

## 2016-02-29 DIAGNOSIS — E7800 Pure hypercholesterolemia, unspecified: Secondary | ICD-10-CM

## 2016-02-29 DIAGNOSIS — Z01419 Encounter for gynecological examination (general) (routine) without abnormal findings: Secondary | ICD-10-CM

## 2016-02-29 DIAGNOSIS — Z1329 Encounter for screening for other suspected endocrine disorder: Secondary | ICD-10-CM

## 2016-03-01 LAB — LIPID PANEL
Chol/HDL Ratio: 5.2 ratio units — ABNORMAL HIGH (ref 0.0–4.4)
Cholesterol, Total: 209 mg/dL — ABNORMAL HIGH (ref 100–199)
HDL: 40 mg/dL (ref >39)
LDL CALC: 145 mg/dL — AB (ref 0–99)
Triglycerides: 121 mg/dL (ref 0–149)
VLDL Cholesterol Cal: 24 mg/dL (ref 5–40)

## 2016-03-01 LAB — CBC
HEMATOCRIT: 42.4 % (ref 34.0–46.6)
Hemoglobin: 14.3 g/dL (ref 11.1–15.9)
MCH: 31.2 pg (ref 26.6–33.0)
MCHC: 33.7 g/dL (ref 31.5–35.7)
MCV: 93 fL (ref 79–97)
PLATELETS: 272 10*3/uL (ref 150–379)
RBC: 4.58 x10E6/uL (ref 3.77–5.28)
RDW: 12.6 % (ref 12.3–15.4)
WBC: 6.7 10*3/uL (ref 3.4–10.8)

## 2016-03-01 LAB — COMPREHENSIVE METABOLIC PANEL
ALBUMIN: 4.4 g/dL (ref 3.5–5.5)
ALK PHOS: 64 IU/L (ref 39–117)
ALT: 39 IU/L — ABNORMAL HIGH (ref 0–32)
AST: 36 IU/L (ref 0–40)
Albumin/Globulin Ratio: 1.8 (ref 1.2–2.2)
BILIRUBIN TOTAL: 0.9 mg/dL (ref 0.0–1.2)
BUN / CREAT RATIO: 10 (ref 9–23)
BUN: 8 mg/dL (ref 6–24)
CHLORIDE: 101 mmol/L (ref 96–106)
CO2: 21 mmol/L (ref 18–29)
Calcium: 9.5 mg/dL (ref 8.7–10.2)
Creatinine, Ser: 0.83 mg/dL (ref 0.57–1.00)
GFR calc Af Amer: 99 mL/min/{1.73_m2} (ref >59)
GFR calc non Af Amer: 86 mL/min/{1.73_m2} (ref >59)
GLUCOSE: 112 mg/dL — AB (ref 65–99)
Globulin, Total: 2.4 g/dL (ref 1.5–4.5)
Potassium: 5 mmol/L (ref 3.5–5.2)
Sodium: 139 mmol/L (ref 134–144)
Total Protein: 6.8 g/dL (ref 6.0–8.5)

## 2016-03-01 LAB — HEMOGLOBIN A1C
ESTIMATED AVERAGE GLUCOSE: 131 mg/dL
Hgb A1c MFr Bld: 6.2 % — ABNORMAL HIGH (ref 4.8–5.6)

## 2016-03-01 LAB — TSH: TSH: 1.29 u[IU]/mL (ref 0.450–4.500)

## 2016-03-04 ENCOUNTER — Other Ambulatory Visit (HOSPITAL_COMMUNITY)
Admission: RE | Admit: 2016-03-04 | Discharge: 2016-03-04 | Disposition: A | Discharge status: Home / Self Care | Payer: BC Managed Care – PPO | Source: Ambulatory Visit | Attending: Adult Health | Admitting: Adult Health

## 2016-03-04 ENCOUNTER — Encounter: Payer: Self-pay | Admitting: Adult Health

## 2016-03-04 ENCOUNTER — Ambulatory Visit (INDEPENDENT_AMBULATORY_CARE_PROVIDER_SITE_OTHER): Payer: BC Managed Care – PPO | Admitting: Adult Health

## 2016-03-04 VITALS — BP 120/60 | HR 88 | Ht 64.0 in | Wt 213.0 lb

## 2016-03-04 DIAGNOSIS — Z01419 Encounter for gynecological examination (general) (routine) without abnormal findings: Secondary | ICD-10-CM

## 2016-03-04 DIAGNOSIS — R7309 Other abnormal glucose: Secondary | ICD-10-CM | POA: Diagnosis not present

## 2016-03-04 DIAGNOSIS — Z1212 Encounter for screening for malignant neoplasm of rectum: Secondary | ICD-10-CM

## 2016-03-04 DIAGNOSIS — E7800 Pure hypercholesterolemia, unspecified: Secondary | ICD-10-CM

## 2016-03-04 DIAGNOSIS — F419 Anxiety disorder, unspecified: Secondary | ICD-10-CM

## 2016-03-04 DIAGNOSIS — Z01411 Encounter for gynecological examination (general) (routine) with abnormal findings: Secondary | ICD-10-CM | POA: Diagnosis not present

## 2016-03-04 DIAGNOSIS — Z1151 Encounter for screening for human papillomavirus (HPV): Secondary | ICD-10-CM | POA: Diagnosis not present

## 2016-03-04 DIAGNOSIS — N951 Menopausal and female climacteric states: Secondary | ICD-10-CM

## 2016-03-04 DIAGNOSIS — R232 Flushing: Secondary | ICD-10-CM

## 2016-03-04 DIAGNOSIS — R739 Hyperglycemia, unspecified: Secondary | ICD-10-CM

## 2016-03-04 LAB — HEMOCCULT GUIAC POC 1CARD (OFFICE): Fecal Occult Blood, POC: NEGATIVE

## 2016-03-04 MED ORDER — ALPRAZOLAM 0.5 MG PO TABS: 0.5000 mg | ORAL_TABLET | Freq: Every evening | ORAL | Status: DC | PRN

## 2016-03-04 NOTE — Progress Notes (Signed)
Patient ID: Ginevra B Gillean, female   DOB: 04/26/1971, 45 y.o.   MRN: [id number] History of Present Illness: Rocky is a 45 year old white female, married in for a well woman gyn exam and pap, she has moved daughter out of dorm and into apartment in [place] and sold her house and bought another and moved twice, so she has been busy.She has hot flashes and has not been good on exercising lately.   Current Medications, Allergies, Past Medical History, Past Surgical History, Family History and Social History were reviewed in [employer] record.     Review of Systems: Patient denies any headaches, hearing loss, fatigue, blurred vision, shortness of breath, chest pain, abdominal pain, problems with bowel movements, urination, or intercourse. No joint pain or mood swings.    Physical Exam:BP 120/60 mmHg  Pulse 88  Ht 5\' 4"  (1.626 m)  Wt 213 lb (96.616 kg)  BMI 36.54 kg/m2 General:  Well developed, well nourished, no acute distress Skin:  Warm and dry,no rashes Neck:  Midline trachea, normal thyroid, good ROM, no lymphadenopathy Lungs; Clear to auscultation bilaterally Breast:  No dominant palpable mass, retraction, or nipple discharge Cardiovascular: Regular rate and rhythm Abdomen:  Soft, non tender, no hepatosplenomegaly Pelvic:  External genitalia is normal in appearance, no lesions.  The vagina is normal in appearance. Urethra has no lesions or masses. The cervix is bulbous.Pap with HPV performed.  Uterus is felt to be normal size, shape, and contour.  No adnexal masses or tenderness noted.Bladder is non tender, no masses felt. Rectal: Good sphincter tone, no polyps, or hemorrhoids felt.  Hemoccult negative. Extremities/musculoskeletal:  No swelling or abnormal veins noted, no clubbing or cyanosis Psych:  No mood changes, alert and cooperative,seems happy Discussed labs, needs to decrease carbs and get back to exercising, and will check labs in 3 months, may consider meds  then.  Impression: Well woman gyn exam and pap Peri menopuse  Hot flashes Anxiety Elevated blood sugar Elevated cholesterol    Plan: Decrease carbs and increase exercise Repeat A1c and CMP and lipids in 3 months Physical in 1 year,pap in 3 if normal Mammogram yearly Continue lexapro has refills Rx xanax 0.5 mg #30 take 1 at hs prn anxiety with 2 refills

## 2016-03-04 NOTE — Patient Instructions (Signed)
Physical in  1 year, pap in 3 if normal Fasting labs in 3 months Mammogram yearly Decrease carbs and increase exercise

## 2016-03-05 LAB — CYTOLOGY - PAP

## 2016-11-25 ENCOUNTER — Other Ambulatory Visit: Payer: Self-pay | Admitting: Adult Health

## 2016-12-09 ENCOUNTER — Telehealth: Payer: Self-pay | Admitting: *Deleted

## 2016-12-09 DIAGNOSIS — E7800 Pure hypercholesterolemia, unspecified: Secondary | ICD-10-CM

## 2016-12-09 DIAGNOSIS — R7989 Other specified abnormal findings of blood chemistry: Secondary | ICD-10-CM

## 2016-12-09 DIAGNOSIS — Z01419 Encounter for gynecological examination (general) (routine) without abnormal findings: Secondary | ICD-10-CM

## 2016-12-09 DIAGNOSIS — Z131 Encounter for screening for diabetes mellitus: Secondary | ICD-10-CM

## 2016-12-09 NOTE — Telephone Encounter (Signed)
Margaret [name] has appt 6/5 wants labs 5/25 will order now.

## 2017-01-11 DIAGNOSIS — C4490 Unspecified malignant neoplasm of skin, unspecified: Secondary | ICD-10-CM

## 2017-01-11 HISTORY — DX: Unspecified malignant neoplasm of skin, unspecified: C44.90

## 2017-01-22 ENCOUNTER — Other Ambulatory Visit: Payer: Self-pay | Admitting: Obstetrics and Gynecology

## 2017-01-22 DIAGNOSIS — Z1231 Encounter for screening mammogram for malignant neoplasm of breast: Secondary | ICD-10-CM

## 2017-02-18 ENCOUNTER — Ambulatory Visit
Admission: RE | Admit: 2017-02-18 | Discharge: 2017-02-18 | Disposition: A | Discharge status: Home / Self Care | Payer: BC Managed Care – PPO | Source: Ambulatory Visit | Attending: Obstetrics and Gynecology | Admitting: Obstetrics and Gynecology

## 2017-02-18 DIAGNOSIS — Z1231 Encounter for screening mammogram for malignant neoplasm of breast: Secondary | ICD-10-CM

## 2017-02-19 ENCOUNTER — Other Ambulatory Visit: Payer: Self-pay | Admitting: Adult Health

## 2017-03-07 LAB — COMPREHENSIVE METABOLIC PANEL
ALBUMIN: 4.1 g/dL (ref 3.5–5.5)
ALK PHOS: 62 IU/L (ref 39–117)
ALT: 39 IU/L — ABNORMAL HIGH (ref 0–32)
AST: 32 IU/L (ref 0–40)
Albumin/Globulin Ratio: 1.7 (ref 1.2–2.2)
BUN / CREAT RATIO: 10 (ref 9–23)
BUN: 8 mg/dL (ref 6–24)
Bilirubin Total: 0.6 mg/dL (ref 0.0–1.2)
CO2: 21 mmol/L (ref 18–29)
CREATININE: 0.79 mg/dL (ref 0.57–1.00)
Calcium: 9.2 mg/dL (ref 8.7–10.2)
Chloride: 103 mmol/L (ref 96–106)
GFR calc non Af Amer: 91 mL/min/{1.73_m2} (ref >59)
GFR, EST AFRICAN AMERICAN: 105 mL/min/{1.73_m2} (ref >59)
GLOBULIN, TOTAL: 2.4 g/dL (ref 1.5–4.5)
Glucose: 114 mg/dL — ABNORMAL HIGH (ref 65–99)
Potassium: 4.5 mmol/L (ref 3.5–5.2)
SODIUM: 140 mmol/L (ref 134–144)
Total Protein: 6.5 g/dL (ref 6.0–8.5)

## 2017-03-07 LAB — LIPID PANEL
Chol/HDL Ratio: 4.5 ratio — ABNORMAL HIGH (ref 0.0–4.4)
Cholesterol, Total: 191 mg/dL (ref 100–199)
HDL: 42 mg/dL (ref >39)
LDL Calculated: 115 mg/dL — ABNORMAL HIGH (ref 0–99)
Triglycerides: 171 mg/dL — ABNORMAL HIGH (ref 0–149)
VLDL CHOLESTEROL CAL: 34 mg/dL (ref 5–40)

## 2017-03-07 LAB — CBC
HEMATOCRIT: 41.9 % (ref 34.0–46.6)
HEMOGLOBIN: 14 g/dL (ref 11.1–15.9)
MCH: 31.6 pg (ref 26.6–33.0)
MCHC: 33.4 g/dL (ref 31.5–35.7)
MCV: 95 fL (ref 79–97)
Platelets: 256 10*3/uL (ref 150–379)
RBC: 4.43 x10E6/uL (ref 3.77–5.28)
RDW: 13.5 % (ref 12.3–15.4)
WBC: 7.3 10*3/uL (ref 3.4–10.8)

## 2017-03-07 LAB — HEMOGLOBIN A1C
ESTIMATED AVERAGE GLUCOSE: 117 mg/dL
HEMOGLOBIN A1C: 5.7 % — AB (ref 4.8–5.6)

## 2017-03-07 LAB — TSH: TSH: 1.34 u[IU]/mL (ref 0.450–4.500)

## 2017-03-07 LAB — VITAMIN D 25 HYDROXY (VIT D DEFICIENCY, FRACTURES): Vit D, 25-Hydroxy: 23.7 ng/mL — ABNORMAL LOW (ref 30.0–100.0)

## 2017-03-17 ENCOUNTER — Ambulatory Visit (INDEPENDENT_AMBULATORY_CARE_PROVIDER_SITE_OTHER): Payer: BC Managed Care – PPO | Admitting: Adult Health

## 2017-03-17 ENCOUNTER — Encounter: Payer: Self-pay | Admitting: Adult Health

## 2017-03-17 VITALS — BP 122/78 | HR 76 | Ht 64.0 in | Wt 221.5 lb

## 2017-03-17 DIAGNOSIS — Z1212 Encounter for screening for malignant neoplasm of rectum: Secondary | ICD-10-CM

## 2017-03-17 DIAGNOSIS — Z1211 Encounter for screening for malignant neoplasm of colon: Secondary | ICD-10-CM

## 2017-03-17 DIAGNOSIS — F419 Anxiety disorder, unspecified: Secondary | ICD-10-CM

## 2017-03-17 DIAGNOSIS — J0100 Acute maxillary sinusitis, unspecified: Secondary | ICD-10-CM | POA: Insufficient documentation

## 2017-03-17 DIAGNOSIS — R42 Dizziness and giddiness: Secondary | ICD-10-CM | POA: Insufficient documentation

## 2017-03-17 DIAGNOSIS — Z01419 Encounter for gynecological examination (general) (routine) without abnormal findings: Secondary | ICD-10-CM | POA: Diagnosis not present

## 2017-03-17 LAB — HEMOCCULT GUIAC POC 1CARD (OFFICE): FECAL OCCULT BLD: NEGATIVE

## 2017-03-17 MED ORDER — AZITHROMYCIN 250 MG PO TABS: ORAL_TABLET | ORAL | 0 refills | Status: DC

## 2017-03-17 MED ORDER — ALPRAZOLAM 0.5 MG PO TABS: ORAL_TABLET | ORAL | 1 refills | Status: DC

## 2017-03-17 NOTE — Progress Notes (Signed)
Patient ID: Margaret [name], female   DOB: 08/19/1971, 46 y.o.   MRN: [id number] History of Present Illness: Margaret [name] is a 46 year old white female, married in for well woman gyn exam,had normal pap with negative HPV 03/23/2016. Her Dad died this year, and daughter graduated from [place] and is gong to Vet school in January.  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  PCP is Dr Quillian Quince.  Current Medications, Allergies, Past Medical History, Past Surgical History, Family History and Social History were reviewed in [employer] record.     Review of Systems:  Patient denies any headaches, hearing loss, fatigue, blurred vision, shortness of breath, chest pain, abdominal pain, problems with bowel movements, urination, or intercourse. No joint pain or mood swings.+vertigo, and left hear ached last night (has been swimming)   Physical Exam:BP 122/78 (BP Location: Left Arm, Patient Position: Sitting, Cuff Size: Large)   Pulse 76   Ht 5\' 4"  (1.626 m)   Wt 221 lb 8 oz (100.5 kg)   BMI 38.02 kg/m  General:  Well developed, well nourished, no acute distress Skin:  Warm and dry Facial tenderness over maxillary sinus and both ears slightly red Neck:  Midline trachea, normal thyroid, good ROM, no lymphadenopathy Lungs; Clear to auscultation bilaterally Breast:  No dominant palpable mass, retraction, or nipple discharge Cardiovascular: Regular rate and rhythm Abdomen:  Soft, non tender, no hepatosplenomegaly Pelvic:  External genitalia is normal in appearance, no lesions.  The vagina is normal in appearance. Urethra has no lesions or masses. The cervix is bulbous.  Uterus is felt to be normal size, shape, and contour.  No adnexal masses or tenderness noted.Bladder is non tender, no masses felt. Rectal: Good sphincter tone, no polyps, or hemorrhoids felt.  Hemoccult negative. Extremities/musculoskeletal:  No swelling or varicosities noted, no clubbing or cyanosis Psych:  No mood changes,  alert and cooperative,seems happy PHQ 2 score 0. Pt has reviewed labs on MyChart and they are better than last year, discussed taking vitamin D regularly/daily, and continue swimming and Zumba and eating healthier.   Impression: 1. Well woman exam with routine gynecological exam   2. Encounter for colorectal cancer screening   3. Subacute maxillary sinusitis   4. Vertigo   5. Anxiety       Plan: Refilled xanax 0.5 mg #30 with 1 refill take 1 at hs prn anxiety Rx azithromycin 250 mg #6 take 2 now and 1 daily for 4 days Can use Antivert OTC Push fluids Use OTC decongestant Continue lexapro has refills Physical in 1 year , pap in 2020 Mammogram yearly Take vitamin D daily now Continue exercise

## 2017-05-14 ENCOUNTER — Other Ambulatory Visit: Payer: Self-pay | Admitting: Adult Health

## 2017-07-27 ENCOUNTER — Other Ambulatory Visit: Payer: Self-pay | Admitting: Adult Health

## 2017-09-08 ENCOUNTER — Other Ambulatory Visit: Payer: Self-pay | Admitting: Adult Health

## 2017-11-09 ENCOUNTER — Other Ambulatory Visit: Payer: Self-pay

## 2017-11-09 ENCOUNTER — Encounter: Payer: Self-pay | Admitting: Adult Health

## 2017-11-09 ENCOUNTER — Telehealth: Payer: Self-pay | Admitting: *Deleted

## 2017-11-09 ENCOUNTER — Ambulatory Visit: Payer: BC Managed Care – PPO | Admitting: Adult Health

## 2017-11-09 VITALS — BP 132/84 | HR 81 | Ht 64.0 in | Wt 198.0 lb

## 2017-11-09 DIAGNOSIS — R300 Dysuria: Secondary | ICD-10-CM | POA: Diagnosis not present

## 2017-11-09 DIAGNOSIS — N764 Abscess of vulva: Secondary | ICD-10-CM | POA: Diagnosis not present

## 2017-11-09 DIAGNOSIS — F419 Anxiety disorder, unspecified: Secondary | ICD-10-CM

## 2017-11-09 LAB — POCT URINALYSIS DIPSTICK
Glucose, UA: NEGATIVE
LEUKOCYTES UA: NEGATIVE
NITRITE UA: NEGATIVE
PROTEIN UA: NEGATIVE
RBC UA: NEGATIVE

## 2017-11-09 MED ORDER — ALPRAZOLAM 0.5 MG PO TABS: ORAL_TABLET | ORAL | 1 refills | Status: DC

## 2017-11-09 MED ORDER — SULFAMETHOXAZOLE-TRIMETHOPRIM 800-160 MG PO TABS: 1.0000 | ORAL_TABLET | Freq: Two times a day (BID) | ORAL | 0 refills | Status: DC

## 2017-11-09 MED ORDER — SILVER SULFADIAZINE 1 % EX CREA
1.0000 | TOPICAL_CREAM | Freq: Two times a day (BID) | CUTANEOUS | 0 refills | Status: DC
Start: 2017-11-09 — End: 2018-07-08

## 2017-11-09 NOTE — Telephone Encounter (Signed)
Mail box is full, can see today or 8:30 in am

## 2017-11-09 NOTE — Progress Notes (Signed)
Subjective:     Patient ID: Margaret [name], female   DOB: 1971-03-08, 47 y.o.   MRN: [id number]  HPI Margaret [name] is a 47 year old white female in complaining of boil and burning when pees and going [place] of the country this weekend,for daughter's white coat ceremony for [employer].She has lost 30 lbs since August on Nutri system.  And she requests refill on xanax.  Review of Systems Burns when pees at times Boil on left labia area Reviewed past medical,surgical, social and family history. Reviewed medications and allergies.     Objective:   Physical Exam BP 132/84 (BP Location: Right Arm, Patient Position: Sitting, Cuff Size: Normal)   Pulse 81   Ht 5\' 4"  (1.626 m)   Wt 198 lb (89.8 kg)   BMI 33.99 kg/m    Urine dipstick is negative. Skin warm and dry has 2 cm boil left upper labia, tender but not red or draining at present, will rx septra ds and silvadene, can use warm compresses, do not squeeze.  Do not shave, clip and wear sun screen.  Assessment:     1. Boil of vulva   2. Dysuria   3. Anxiety       Plan:     Meds ordered this encounter  Medications  . ALPRAZolam (XANAX) 0.5 MG tablet    Sig: TAKE ONE TABLET BY MOUTH AT BEDTIME AS NEEDED FOR ANXIETY    Dispense:  30 tablet    Refill:  1    This prescription was filled on 08/19/2017. Any refills authorized will be placed on file.    Order Specific Question:   Supervising Provider    Answer:   Tania Ade H [[id number]]  . sulfamethoxazole-trimethoprim (BACTRIM DS,SEPTRA DS) 800-160 MG tablet    Sig: Take 1 tablet by mouth 2 (two) times daily. Take 1 bid    Dispense:  28 tablet    Refill:  0    Order Specific Question:   Supervising Provider    Answer:   EURE, LUTHER H [[id number]]  . silver sulfADIAZINE (SILVADENE) 1 % cream    Sig: Apply 1 application topically 2 (two) times daily.    Dispense:  25 g    Refill:  0    Order Specific Question:   Supervising Provider    Answer:   Tania Ade H [[id number]]  F/U prn

## 2018-01-08 ENCOUNTER — Other Ambulatory Visit: Payer: Self-pay | Admitting: Obstetrics and Gynecology

## 2018-01-08 DIAGNOSIS — Z1231 Encounter for screening mammogram for malignant neoplasm of breast: Secondary | ICD-10-CM

## 2018-02-19 ENCOUNTER — Ambulatory Visit
Admission: RE | Admit: 2018-02-19 | Discharge: 2018-02-19 | Disposition: A | Discharge status: Home / Self Care | Payer: BC Managed Care – PPO | Source: Ambulatory Visit | Attending: Obstetrics and Gynecology | Admitting: Obstetrics and Gynecology

## 2018-02-19 DIAGNOSIS — Z1231 Encounter for screening mammogram for malignant neoplasm of breast: Secondary | ICD-10-CM

## 2018-05-31 ENCOUNTER — Other Ambulatory Visit: Payer: Self-pay | Admitting: Adult Health

## 2018-05-31 MED ORDER — ALPRAZOLAM 0.5 MG PO TABS: ORAL_TABLET | ORAL | 1 refills | Status: DC

## 2018-05-31 NOTE — Progress Notes (Signed)
Refill xanax

## 2018-06-30 ENCOUNTER — Other Ambulatory Visit: Payer: BC Managed Care – PPO

## 2018-06-30 DIAGNOSIS — Z13220 Encounter for screening for lipoid disorders: Secondary | ICD-10-CM

## 2018-06-30 DIAGNOSIS — Z131 Encounter for screening for diabetes mellitus: Secondary | ICD-10-CM

## 2018-06-30 DIAGNOSIS — Z01419 Encounter for gynecological examination (general) (routine) without abnormal findings: Secondary | ICD-10-CM

## 2018-06-30 DIAGNOSIS — Z1329 Encounter for screening for other suspected endocrine disorder: Secondary | ICD-10-CM

## 2018-06-30 NOTE — Addendum Note (Signed)
Addended by: Octaviano Glow on: 06/30/2018 09:06 AM   Modules accepted: Orders

## 2018-07-01 LAB — LIPID PANEL
CHOL/HDL RATIO: 4.2 ratio (ref 0.0–4.4)
Cholesterol, Total: 207 mg/dL — ABNORMAL HIGH (ref 100–199)
HDL: 49 mg/dL (ref >39)
LDL CALC: 131 mg/dL — AB (ref 0–99)
TRIGLYCERIDES: 133 mg/dL (ref 0–149)
VLDL Cholesterol Cal: 27 mg/dL (ref 5–40)

## 2018-07-01 LAB — COMPREHENSIVE METABOLIC PANEL
ALBUMIN: 4.2 g/dL (ref 3.5–5.5)
ALT: 27 IU/L (ref 0–32)
AST: 24 IU/L (ref 0–40)
Albumin/Globulin Ratio: 1.8 (ref 1.2–2.2)
Alkaline Phosphatase: 62 IU/L (ref 39–117)
BILIRUBIN TOTAL: 0.6 mg/dL (ref 0.0–1.2)
BUN / CREAT RATIO: 10 (ref 9–23)
BUN: 7 mg/dL (ref 6–24)
CALCIUM: 9.3 mg/dL (ref 8.7–10.2)
CHLORIDE: 100 mmol/L (ref 96–106)
CO2: 23 mmol/L (ref 20–29)
CREATININE: 0.68 mg/dL (ref 0.57–1.00)
GFR calc Af Amer: 120 mL/min/{1.73_m2} (ref >59)
GFR, EST NON AFRICAN AMERICAN: 105 mL/min/{1.73_m2} (ref >59)
GLUCOSE: 107 mg/dL — AB (ref 65–99)
Globulin, Total: 2.3 g/dL (ref 1.5–4.5)
Potassium: 4.4 mmol/L (ref 3.5–5.2)
Sodium: 137 mmol/L (ref 134–144)
Total Protein: 6.5 g/dL (ref 6.0–8.5)

## 2018-07-01 LAB — HEMOGLOBIN A1C
Est. average glucose Bld gHb Est-mCnc: 117 mg/dL
HEMOGLOBIN A1C: 5.7 % — AB (ref 4.8–5.6)

## 2018-07-01 LAB — CBC
HEMOGLOBIN: 13.8 g/dL (ref 11.1–15.9)
Hematocrit: 40.6 % (ref 34.0–46.6)
MCH: 30.9 pg (ref 26.6–33.0)
MCHC: 34 g/dL (ref 31.5–35.7)
MCV: 91 fL (ref 79–97)
Platelets: 273 10*3/uL (ref 150–450)
RBC: 4.46 x10E6/uL (ref 3.77–5.28)
RDW: 12.3 % (ref 12.3–15.4)
WBC: 6.5 10*3/uL (ref 3.4–10.8)

## 2018-07-01 LAB — TSH: TSH: 1.46 u[IU]/mL (ref 0.450–4.500)

## 2018-07-01 LAB — VITAMIN D 25 HYDROXY (VIT D DEFICIENCY, FRACTURES): VIT D 25 HYDROXY: 24.1 ng/mL — AB (ref 30.0–100.0)

## 2018-07-08 ENCOUNTER — Encounter: Payer: Self-pay | Admitting: Adult Health

## 2018-07-08 ENCOUNTER — Ambulatory Visit (INDEPENDENT_AMBULATORY_CARE_PROVIDER_SITE_OTHER): Payer: BC Managed Care – PPO | Admitting: Adult Health

## 2018-07-08 ENCOUNTER — Other Ambulatory Visit: Payer: Self-pay

## 2018-07-08 VITALS — BP 137/93 | HR 82 | Ht 64.0 in | Wt 216.2 lb

## 2018-07-08 DIAGNOSIS — Z01411 Encounter for gynecological examination (general) (routine) with abnormal findings: Secondary | ICD-10-CM | POA: Diagnosis not present

## 2018-07-08 DIAGNOSIS — F419 Anxiety disorder, unspecified: Secondary | ICD-10-CM | POA: Diagnosis not present

## 2018-07-08 DIAGNOSIS — Z1212 Encounter for screening for malignant neoplasm of rectum: Secondary | ICD-10-CM | POA: Diagnosis not present

## 2018-07-08 DIAGNOSIS — Z1211 Encounter for screening for malignant neoplasm of colon: Secondary | ICD-10-CM | POA: Diagnosis not present

## 2018-07-08 DIAGNOSIS — Z01419 Encounter for gynecological examination (general) (routine) without abnormal findings: Secondary | ICD-10-CM

## 2018-07-08 DIAGNOSIS — L0292 Furuncle, unspecified: Secondary | ICD-10-CM | POA: Diagnosis not present

## 2018-07-08 LAB — HEMOCCULT GUIAC POC 1CARD (OFFICE): Fecal Occult Blood, POC: NEGATIVE

## 2018-07-08 MED ORDER — SULFAMETHOXAZOLE-TRIMETHOPRIM 800-160 MG PO TABS: 1.0000 | ORAL_TABLET | Freq: Two times a day (BID) | ORAL | 1 refills | Status: DC

## 2018-07-08 NOTE — Progress Notes (Signed)
Patient ID: Margaret [name], female   DOB: 02-12-1971, 47 y.o.   MRN: [id number] History of Present Illness: Marc is a 47 year old white female in for a well woman gyn exam, she ha normal pap with negative HPV 03/04/16. PCP is Dr Quillian Quince.   Current Medications, Allergies, Past Medical History, Past Surgical History, Family History and Social History were reviewed in [employer] record.     Review of Systems: Patient denies any headaches, hearing loss, fatigue, blurred vision, shortness of breath, chest pain, abdominal pain, problems with bowel movements, urination, or intercourse. No joint pain or mood swings. Has boil on low abdomen, noticed Monday.    Physical Exam:BP (!) 137/93 (BP Location: Left Arm, Patient Position: Sitting, Cuff Size: Normal)   Pulse 82   Ht 5\' 4"  (1.626 m)   Wt 216 lb 3.2 oz (98.1 kg)   BMI 37.11 kg/m  General:  Well developed, well nourished, no acute distress Skin:  Warm and dry Neck:  Midline trachea, normal thyroid, good ROM, no lymphadenopathy Lungs; Clear to auscultation bilaterally Breast:  No dominant palpable mass, retraction, or nipple discharge Cardiovascular: Regular rate and rhythm Abdomen:  Soft, non tender, no hepatosplenomegaly,boil right lower abdomen, looks purple, and is tender. Pelvic:  External genitalia is normal in appearance, no lesions.  The vagina is normal in appearance. Urethra has no lesions or masses. The cervix is bulbous.  Uterus is felt to be normal size, shape, and contour.  No adnexal masses or tenderness noted.Bladder is non tender, no masses felt. Rectal: Good sphincter tone, no polyps, or hemorrhoids felt.  Hemoccult negative. Extremities/musculoskeletal:  No swelling or varicosities noted, no clubbing or cyanosis Psych:  No mood changes, alert and cooperative,seems happy PHQ 2 score 0. Examination chaperoned by Estill Bamberg Rash LPN.  Impression: 1. Encounter for well woman exam with routine gynecological  exam   2. Screening for colorectal cancer   3. Boil   4. Anxiety       Plan: Meds ordered this encounter  Medications  . sulfamethoxazole-trimethoprim (BACTRIM DS,SEPTRA DS) 800-160 MG tablet    Sig: Take 1 tablet by mouth 2 (two) times daily. Take 1 bid    Dispense:  28 tablet    Refill:  1    Order Specific Question:   Supervising Provider    Answer:   Tania Ade H [[id number]]  Use warm compresses Continue lexapro, has refills and has refill on xanax Pap and physical in 1 year Take vitamin D Mammogram yearly

## 2018-07-28 ENCOUNTER — Other Ambulatory Visit: Payer: Self-pay | Admitting: Adult Health

## 2018-08-18 ENCOUNTER — Other Ambulatory Visit: Payer: Self-pay | Admitting: Adult Health

## 2018-11-04 ENCOUNTER — Other Ambulatory Visit: Payer: Self-pay | Admitting: Obstetrics & Gynecology

## 2019-03-28 ENCOUNTER — Other Ambulatory Visit: Payer: Self-pay | Admitting: Obstetrics and Gynecology

## 2019-03-28 DIAGNOSIS — Z1231 Encounter for screening mammogram for malignant neoplasm of breast: Secondary | ICD-10-CM

## 2019-04-14 ENCOUNTER — Ambulatory Visit
Admission: RE | Admit: 2019-04-14 | Discharge: 2019-04-14 | Disposition: A | Discharge status: Home / Self Care | Payer: BC Managed Care – PPO | Source: Ambulatory Visit | Attending: Obstetrics and Gynecology | Admitting: Obstetrics and Gynecology

## 2019-04-14 ENCOUNTER — Other Ambulatory Visit: Payer: Self-pay

## 2019-04-14 DIAGNOSIS — Z1231 Encounter for screening mammogram for malignant neoplasm of breast: Secondary | ICD-10-CM

## 2019-04-26 ENCOUNTER — Other Ambulatory Visit: Payer: Self-pay | Admitting: Adult Health

## 2019-05-12 ENCOUNTER — Other Ambulatory Visit: Payer: Self-pay | Admitting: Obstetrics & Gynecology

## 2019-07-15 ENCOUNTER — Other Ambulatory Visit: Payer: Self-pay | Admitting: *Deleted

## 2019-07-15 DIAGNOSIS — Z20822 Contact with and (suspected) exposure to covid-19: Secondary | ICD-10-CM

## 2019-07-17 LAB — NOVEL CORONAVIRUS, NAA: SARS-CoV-2, NAA: NOT DETECTED

## 2019-08-22 ENCOUNTER — Other Ambulatory Visit: Payer: Self-pay | Admitting: Adult Health

## 2019-10-24 ENCOUNTER — Other Ambulatory Visit: Payer: Self-pay

## 2019-10-24 ENCOUNTER — Other Ambulatory Visit: Payer: Self-pay | Admitting: Adult Health

## 2019-10-24 ENCOUNTER — Other Ambulatory Visit: Payer: BC Managed Care – PPO

## 2019-10-24 DIAGNOSIS — R7989 Other specified abnormal findings of blood chemistry: Secondary | ICD-10-CM

## 2019-10-24 DIAGNOSIS — Z131 Encounter for screening for diabetes mellitus: Secondary | ICD-10-CM

## 2019-10-24 DIAGNOSIS — Z01419 Encounter for gynecological examination (general) (routine) without abnormal findings: Secondary | ICD-10-CM

## 2019-10-24 NOTE — Progress Notes (Signed)
Orders in for labs.

## 2019-10-25 LAB — COMPREHENSIVE METABOLIC PANEL
ALT: 48 IU/L — ABNORMAL HIGH (ref 0–32)
AST: 36 IU/L (ref 0–40)
Albumin/Globulin Ratio: 1.7 (ref 1.2–2.2)
Albumin: 4.3 g/dL (ref 3.8–4.8)
Alkaline Phosphatase: 74 IU/L (ref 39–117)
BUN/Creatinine Ratio: 12 (ref 9–23)
BUN: 9 mg/dL (ref 6–24)
Bilirubin Total: 0.7 mg/dL (ref 0.0–1.2)
CO2: 23 mmol/L (ref 20–29)
Calcium: 9.5 mg/dL (ref 8.7–10.2)
Chloride: 103 mmol/L (ref 96–106)
Creatinine, Ser: 0.78 mg/dL (ref 0.57–1.00)
GFR calc Af Amer: 104 mL/min/{1.73_m2} (ref >59)
GFR calc non Af Amer: 90 mL/min/{1.73_m2} (ref >59)
Globulin, Total: 2.6 g/dL (ref 1.5–4.5)
Glucose: 117 mg/dL — ABNORMAL HIGH (ref 65–99)
Potassium: 4.8 mmol/L (ref 3.5–5.2)
Sodium: 140 mmol/L (ref 134–144)
Total Protein: 6.9 g/dL (ref 6.0–8.5)

## 2019-10-25 LAB — HEMOGLOBIN A1C
Est. average glucose Bld gHb Est-mCnc: 123 mg/dL
Hgb A1c MFr Bld: 5.9 % — ABNORMAL HIGH (ref 4.8–5.6)

## 2019-10-25 LAB — CBC
Hematocrit: 44.7 % (ref 34.0–46.6)
Hemoglobin: 14.5 g/dL (ref 11.1–15.9)
MCH: 30.7 pg (ref 26.6–33.0)
MCHC: 32.4 g/dL (ref 31.5–35.7)
MCV: 95 fL (ref 79–97)
Platelets: 242 10*3/uL (ref 150–450)
RBC: 4.73 x10E6/uL (ref 3.77–5.28)
RDW: 12.6 % (ref 11.7–15.4)
WBC: 6.5 10*3/uL (ref 3.4–10.8)

## 2019-10-25 LAB — LIPID PANEL
Chol/HDL Ratio: 6 ratio — ABNORMAL HIGH (ref 0.0–4.4)
Cholesterol, Total: 235 mg/dL — ABNORMAL HIGH (ref 100–199)
HDL: 39 mg/dL — ABNORMAL LOW (ref >39)
LDL Chol Calc (NIH): 163 mg/dL — ABNORMAL HIGH (ref 0–99)
Triglycerides: 178 mg/dL — ABNORMAL HIGH (ref 0–149)
VLDL Cholesterol Cal: 33 mg/dL (ref 5–40)

## 2019-10-25 LAB — VITAMIN D 25 HYDROXY (VIT D DEFICIENCY, FRACTURES): Vit D, 25-Hydroxy: 21.9 ng/mL — ABNORMAL LOW (ref 30.0–100.0)

## 2019-10-25 LAB — TSH: TSH: 1.55 u[IU]/mL (ref 0.450–4.500)

## 2019-10-28 ENCOUNTER — Encounter: Payer: Self-pay | Admitting: Adult Health

## 2019-10-28 ENCOUNTER — Other Ambulatory Visit: Payer: Self-pay

## 2019-10-28 ENCOUNTER — Ambulatory Visit (INDEPENDENT_AMBULATORY_CARE_PROVIDER_SITE_OTHER): Payer: BC Managed Care – PPO | Admitting: Adult Health

## 2019-10-28 ENCOUNTER — Other Ambulatory Visit (HOSPITAL_COMMUNITY)
Admission: RE | Admit: 2019-10-28 | Discharge: 2019-10-28 | Disposition: A | Discharge status: Home / Self Care | Payer: BC Managed Care – PPO | Source: Ambulatory Visit | Attending: Adult Health | Admitting: Adult Health

## 2019-10-28 VITALS — BP 134/86 | HR 90 | Ht 64.0 in | Wt 226.0 lb

## 2019-10-28 DIAGNOSIS — R7989 Other specified abnormal findings of blood chemistry: Secondary | ICD-10-CM | POA: Diagnosis not present

## 2019-10-28 DIAGNOSIS — Z1212 Encounter for screening for malignant neoplasm of rectum: Secondary | ICD-10-CM | POA: Diagnosis not present

## 2019-10-28 DIAGNOSIS — R7309 Other abnormal glucose: Secondary | ICD-10-CM

## 2019-10-28 DIAGNOSIS — F419 Anxiety disorder, unspecified: Secondary | ICD-10-CM

## 2019-10-28 DIAGNOSIS — Z1211 Encounter for screening for malignant neoplasm of colon: Secondary | ICD-10-CM | POA: Diagnosis not present

## 2019-10-28 DIAGNOSIS — Z01419 Encounter for gynecological examination (general) (routine) without abnormal findings: Secondary | ICD-10-CM

## 2019-10-28 DIAGNOSIS — E785 Hyperlipidemia, unspecified: Secondary | ICD-10-CM

## 2019-10-28 LAB — HEMOCCULT GUIAC POC 1CARD (OFFICE): Fecal Occult Blood, POC: NEGATIVE

## 2019-10-28 NOTE — Progress Notes (Addendum)
Patient ID: Margaret [name], female   DOB: [birth year], 49 y.o.   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 PCP is Dr Quillian Quince.   Current Medications, Allergies, Past Medical History, Past Surgical History, Family History and Social History were reviewed in [employer] record.     Review of Systems: Patient denies any headaches, hearing loss, fatigue, blurred vision, shortness of breath, chest pain, abdominal pain, problems with bowel movements, urination, or intercourse. No joint pain or mood swings.    Physical Exam:BP 134/86 (BP Location: Left Arm, Patient Position: Sitting, Cuff Size: Normal)   Pulse 90   Ht 5\' 4"  (1.626 m)   Wt 226 lb (102.5 kg)   BMI 38.79 kg/m  General:  Well developed, well nourished, no acute distress Skin:  Warm and dry Neck:  Midline trachea, normal thyroid, good ROM, no lymphadenopathy Lungs; Clear to auscultation bilaterally Breast:  No dominant palpable mass, retraction, or nipple discharge Cardiovascular: Regular rate and rhythm Abdomen:  Soft, non tender, no hepatosplenomegaly Pelvic:  External genitalia is normal in appearance, no lesions.  The vagina is normal in appearance. Urethra has no lesions or masses. The cervix is bulbous,pap with high risk HPV 16/18 genotyping performed.  Uterus is felt to be normal size, shape, and contour.  No adnexal masses or tenderness noted.Bladder is non tender, no masses felt. Rectal: Good sphincter tone, no polyps, or hemorrhoids felt.  Hemoccult negative. Extremities/musculoskeletal:  No swelling, some spider veins noted, no clubbing or cyanosis Psych:  No mood changes, alert and cooperative,seems happy Fall risk is lwo PHQ 2 score 0 Exam performed with out a chaperone with he  verbal permission  Reviewed labs with pt, cholesterol is up A1 c is up and vitam in D is down, she says she does not want a statin and will do better has not done well since September with diet.  Impression and Plan: 1. Encounter for gynecological examination with Papanicolaou smear of cervix Pap sent  Physical in 1 year Pap in 3 if normal Mammogram yearly  2. Screening for colorectal cancer Colonoscopy at 50  3. Anxiety Continue lexapro has refills   4. Low vitamin D level Take 5000 IU Vitamin D daily  5. Dyslipidemia Eat cleaner Continue falx se and red yeast rice Walk more Recheck in 6 months   6. Elevated hemoglobin A1c Recheck in 6 months

## 2019-10-31 LAB — CYTOLOGY - PAP
Adequacy: ABSENT
Comment: NEGATIVE
Diagnosis: NEGATIVE
High risk HPV: NEGATIVE

## 2019-11-18 ENCOUNTER — Other Ambulatory Visit: Payer: Self-pay | Admitting: Obstetrics & Gynecology

## 2019-11-21 ENCOUNTER — Other Ambulatory Visit: Payer: Self-pay | Admitting: Adult Health

## 2019-11-21 MED ORDER — ALPRAZOLAM 0.5 MG PO TABS: ORAL_TABLET | ORAL | 1 refills | Status: DC

## 2019-11-21 NOTE — Progress Notes (Signed)
Refilled xanax

## 2020-01-30 ENCOUNTER — Other Ambulatory Visit: Payer: Self-pay | Admitting: Adult Health

## 2020-04-13 ENCOUNTER — Other Ambulatory Visit: Payer: Self-pay | Admitting: Obstetrics and Gynecology

## 2020-04-13 DIAGNOSIS — Z1231 Encounter for screening mammogram for malignant neoplasm of breast: Secondary | ICD-10-CM

## 2020-04-29 ENCOUNTER — Other Ambulatory Visit: Payer: Self-pay | Admitting: Adult Health

## 2020-05-03 ENCOUNTER — Other Ambulatory Visit: Payer: Self-pay

## 2020-05-03 ENCOUNTER — Ambulatory Visit
Admission: RE | Admit: 2020-05-03 | Discharge: 2020-05-03 | Disposition: A | Discharge status: Home / Self Care | Payer: BC Managed Care – PPO | Source: Ambulatory Visit

## 2020-05-03 DIAGNOSIS — Z1231 Encounter for screening mammogram for malignant neoplasm of breast: Secondary | ICD-10-CM

## 2020-05-08 NOTE — Progress Notes (Signed)
Normal mammogram, for annual repeat

## 2020-05-08 NOTE — Progress Notes (Signed)
Normal mammogram

## 2020-10-01 ENCOUNTER — Other Ambulatory Visit: Payer: Self-pay | Admitting: Adult Health

## 2020-10-06 ENCOUNTER — Other Ambulatory Visit: Payer: Self-pay | Admitting: Adult Health

## 2021-01-12 IMAGING — MG DIGITAL SCREENING BILATERAL MAMMOGRAM WITH TOMO AND CAD
8 series · 8 of 24 positions shown · non-contrast
Comparison: Previous exam(s).

CLINICAL DATA: Screening.

EXAM:
DIGITAL SCREENING BILATERAL MAMMOGRAM WITH TOMO AND CAD

[R MLO synth-2D]
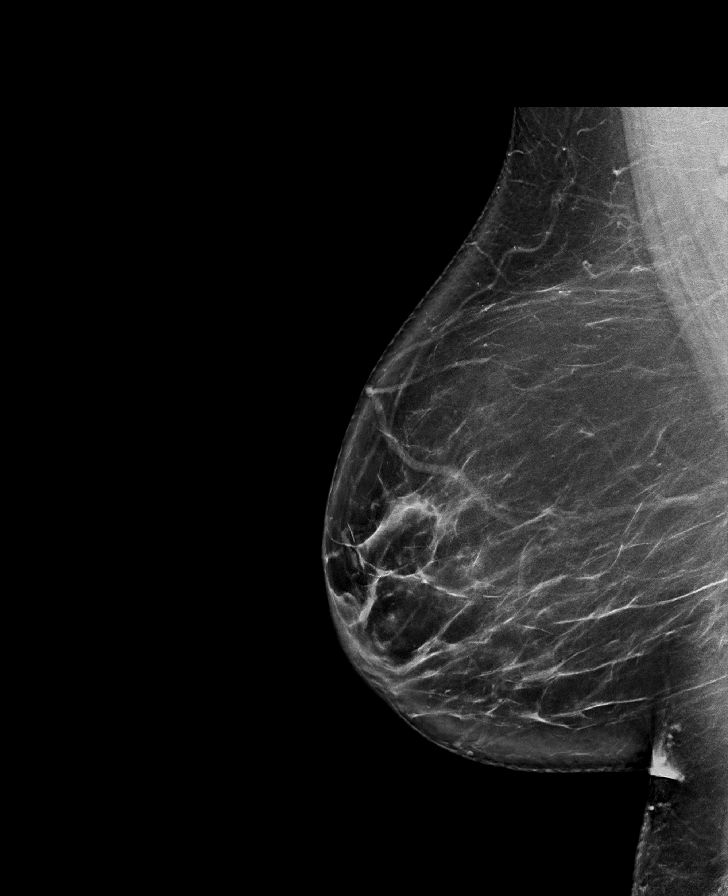

[R CC synth-2D]
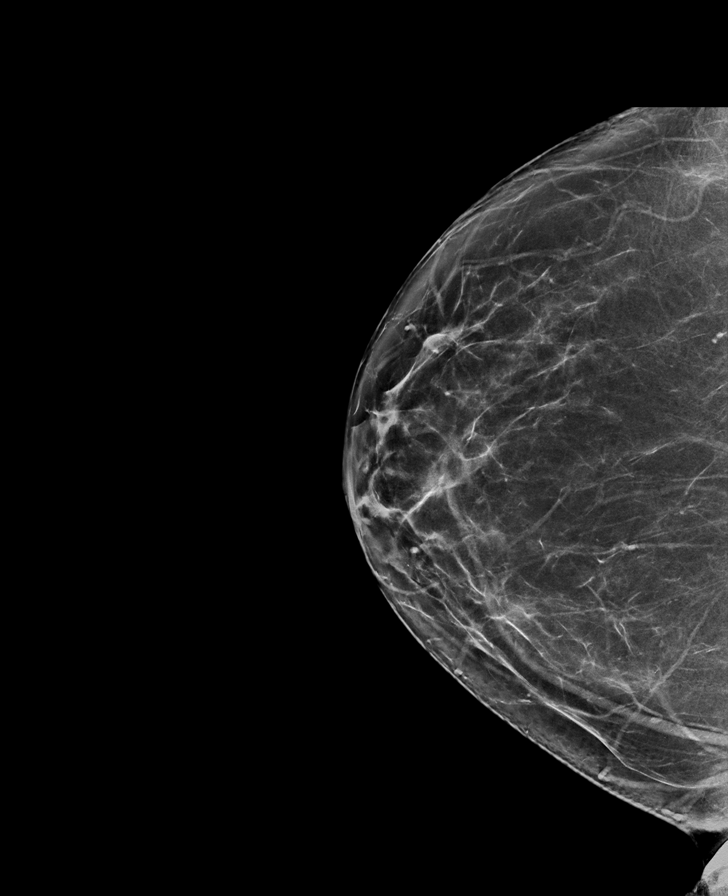

[L MLO synth-2D]
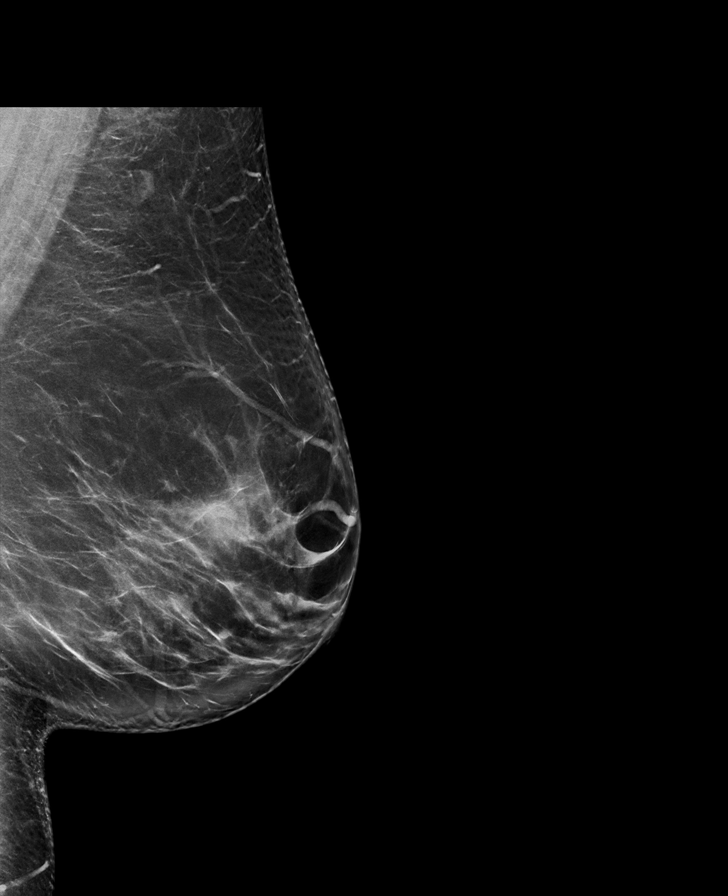

[L CC synth-2D]
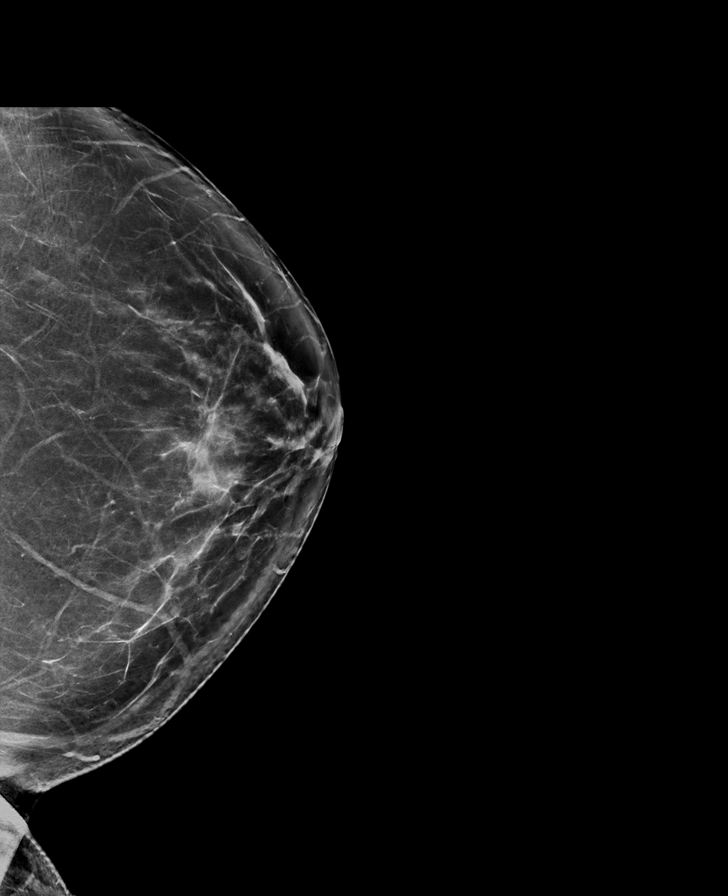

[L CC tomo · tomo slice 43/85.0]
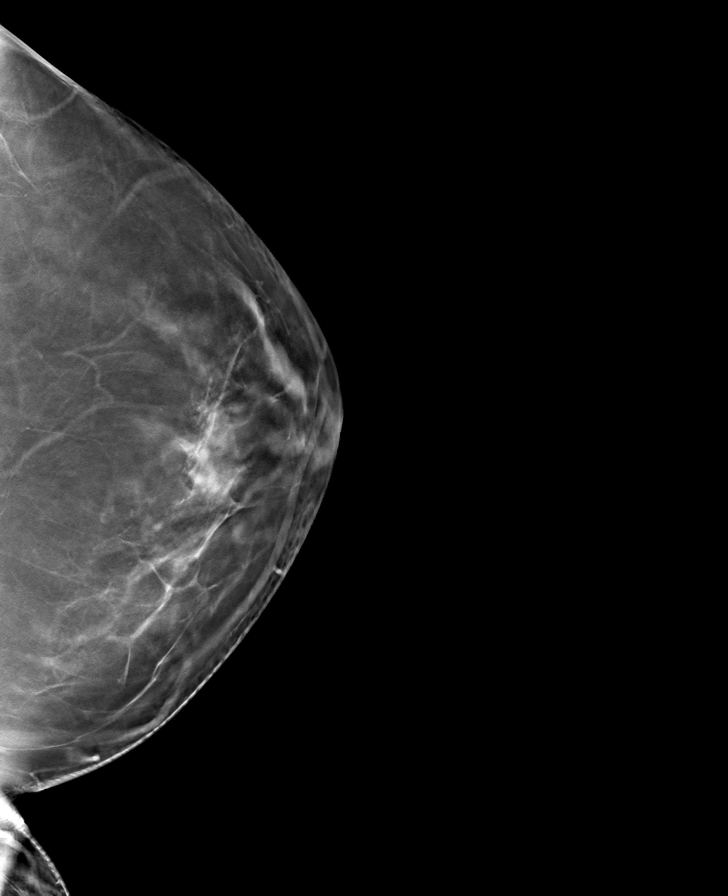

[L MLO tomo · tomo slice 45/90.0]
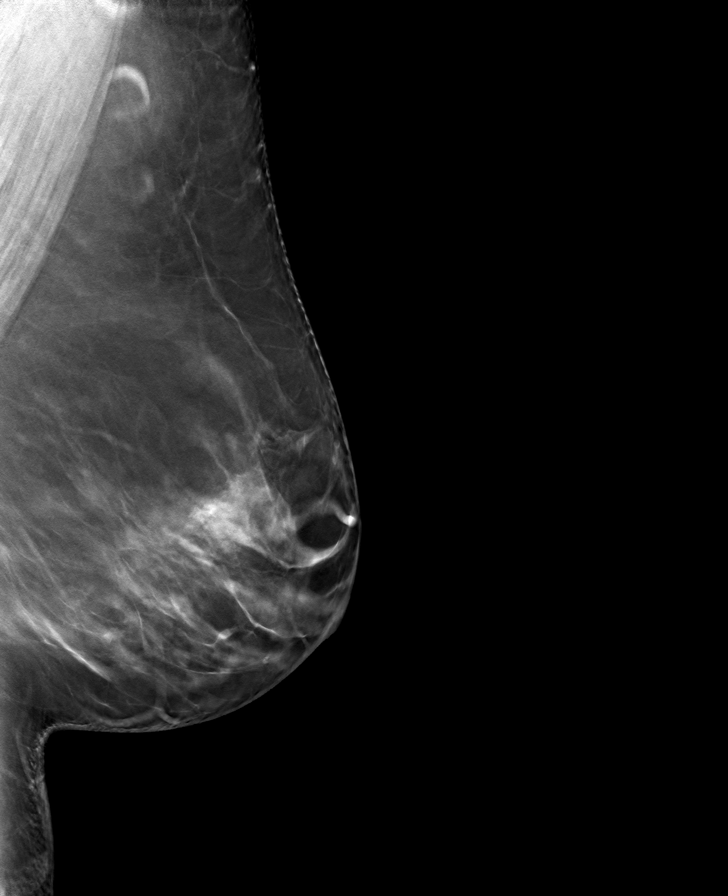

[R CC tomo · tomo slice 41/80.0]
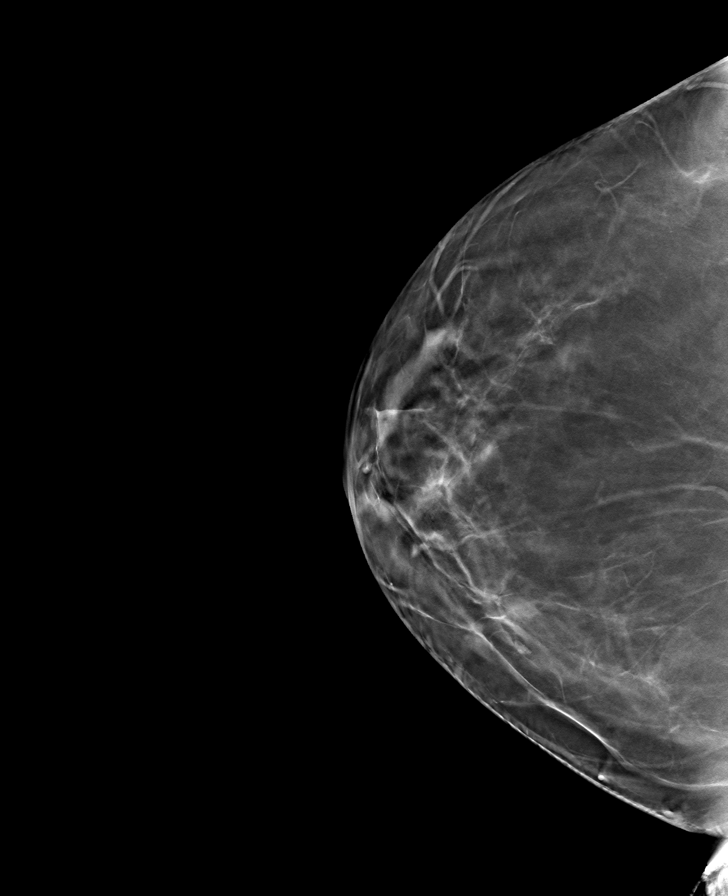

[R MLO tomo · tomo slice 49/98.0]
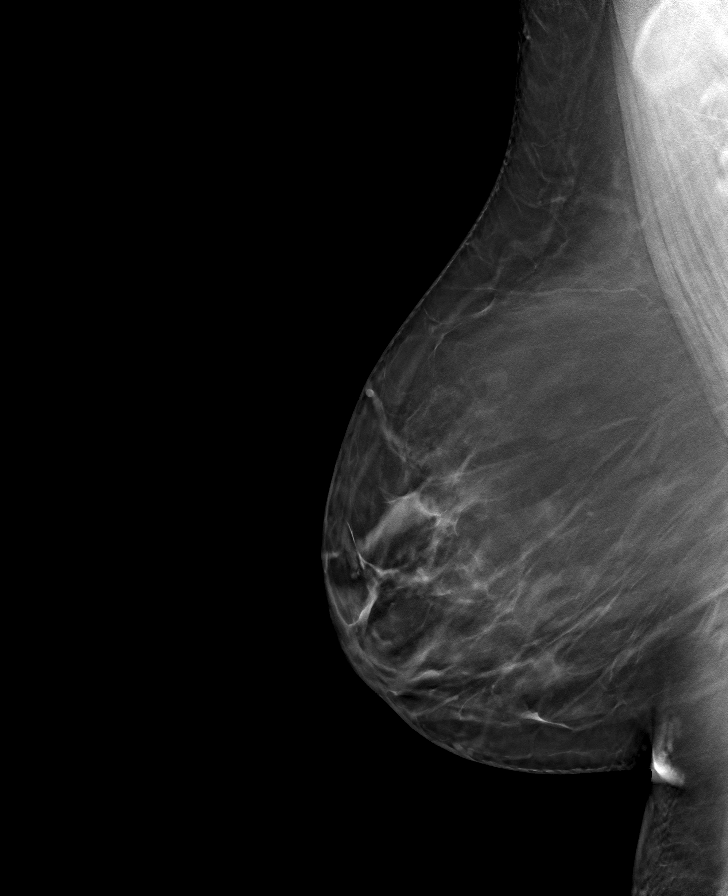

[8 of 24 positions shown; findings below may reference images not displayed]

ACR Breast Density Category b: There are scattered areas of
fibroglandular density.
FINDINGS: There are no findings suspicious for malignancy. Images were
processed with CAD.
IMPRESSION: No mammographic evidence of malignancy. A result letter of this
screening mammogram will be mailed directly to the patient.

RECOMMENDATION:
Screening mammogram in one year. (Code:[id number])

BI-RADS CATEGORY  1: Negative.

## 2021-02-11 ENCOUNTER — Other Ambulatory Visit: Payer: Self-pay | Admitting: Obstetrics & Gynecology

## 2021-02-11 ENCOUNTER — Telehealth: Payer: Self-pay | Admitting: Adult Health

## 2021-02-11 DIAGNOSIS — Z1231 Encounter for screening mammogram for malignant neoplasm of breast: Secondary | ICD-10-CM

## 2021-02-11 NOTE — Telephone Encounter (Signed)
   Appointment Request From: Marli B Chohan  With Provider: Derrek Monaco, NP [facility]]  Preferred Date Range: 04/11/2021 - 05/10/2021  Preferred Times: Tuesday Afternoon, Wednesday Afternoon, Thursday Afternoon, Friday Afternoon  Reason for visit: Request an Appointment  Comments: Annual physical.  Pap Smear. Will need blood work prior to visit so that results are available at appointment.  No vitamin D screening as this is not covered by insurance.  Thank you!

## 2021-02-15 ENCOUNTER — Telehealth: Payer: Self-pay | Admitting: Adult Health

## 2021-02-15 DIAGNOSIS — E785 Hyperlipidemia, unspecified: Secondary | ICD-10-CM

## 2021-02-15 DIAGNOSIS — R7309 Other abnormal glucose: Secondary | ICD-10-CM

## 2021-02-15 DIAGNOSIS — Z01419 Encounter for gynecological examination (general) (routine) without abnormal findings: Secondary | ICD-10-CM

## 2021-02-15 NOTE — Telephone Encounter (Signed)
Patient is scheduled for Pap/physical on July 5,2022 and patient wants to know if she could get her lab work done a week prior to her scheduled appt . Patient was informed that the provider has to put in the orders first. Patient also stated for [facility] not include Vitamin D, because her insurance doesn't cover this. Please review & advise on when to schedule labs.

## 2021-02-18 NOTE — Telephone Encounter (Signed)
Left message that orders are in for labs

## 2021-04-11 ENCOUNTER — Telehealth: Payer: Self-pay | Admitting: Adult Health

## 2021-04-11 LAB — COMPREHENSIVE METABOLIC PANEL
ALT: 115 IU/L — ABNORMAL HIGH (ref 0–32)
AST: 134 IU/L — ABNORMAL HIGH (ref 0–40)
Albumin/Globulin Ratio: 1.6 (ref 1.2–2.2)
Albumin: 4.4 g/dL (ref 3.8–4.8)
Alkaline Phosphatase: 82 IU/L (ref 44–121)
BUN/Creatinine Ratio: 12 (ref 9–23)
BUN: 9 mg/dL (ref 6–24)
Bilirubin Total: 0.7 mg/dL (ref 0.0–1.2)
CO2: 24 mmol/L (ref 20–29)
Calcium: 10.1 mg/dL (ref 8.7–10.2)
Chloride: 101 mmol/L (ref 96–106)
Creatinine, Ser: 0.78 mg/dL (ref 0.57–1.00)
Globulin, Total: 2.7 g/dL (ref 1.5–4.5)
Glucose: 132 mg/dL — ABNORMAL HIGH (ref 65–99)
Potassium: 4.9 mmol/L (ref 3.5–5.2)
Sodium: 140 mmol/L (ref 134–144)
Total Protein: 7.1 g/dL (ref 6.0–8.5)
eGFR: 92 mL/min/{1.73_m2} (ref >59)

## 2021-04-11 LAB — CBC
Hematocrit: 46.7 % — ABNORMAL HIGH (ref 34.0–46.6)
Hemoglobin: 15 g/dL (ref 11.1–15.9)
MCH: 30.2 pg (ref 26.6–33.0)
MCHC: 32.1 g/dL (ref 31.5–35.7)
MCV: 94 fL (ref 79–97)
Platelets: 262 10*3/uL (ref 150–450)
RBC: 4.96 x10E6/uL (ref 3.77–5.28)
RDW: 12.7 % (ref 11.7–15.4)
WBC: 7 10*3/uL (ref 3.4–10.8)

## 2021-04-11 LAB — LIPID PANEL
Chol/HDL Ratio: 5.4 ratio — ABNORMAL HIGH (ref 0.0–4.4)
Cholesterol, Total: 243 mg/dL — ABNORMAL HIGH (ref 100–199)
HDL: 45 mg/dL (ref >39)
LDL Chol Calc (NIH): 167 mg/dL — ABNORMAL HIGH (ref 0–99)
Triglycerides: 169 mg/dL — ABNORMAL HIGH (ref 0–149)
VLDL Cholesterol Cal: 31 mg/dL (ref 5–40)

## 2021-04-11 LAB — HEMOGLOBIN A1C
Est. average glucose Bld gHb Est-mCnc: 137 mg/dL
Hgb A1c MFr Bld: 6.4 % — ABNORMAL HIGH (ref 4.8–5.6)

## 2021-04-11 LAB — TSH: TSH: 1.31 u[IU]/mL (ref 0.450–4.500)

## 2021-04-11 NOTE — Telephone Encounter (Signed)
Pt aware of labs, will discuss further at visit 04/16/21 decrease alcohol and tylenol use now,

## 2021-04-16 ENCOUNTER — Ambulatory Visit (INDEPENDENT_AMBULATORY_CARE_PROVIDER_SITE_OTHER): Payer: BC Managed Care – PPO | Admitting: Adult Health

## 2021-04-16 ENCOUNTER — Encounter: Payer: Self-pay | Admitting: Adult Health

## 2021-04-16 ENCOUNTER — Other Ambulatory Visit: Payer: Self-pay

## 2021-04-16 VITALS — BP 131/86 | HR 82 | Ht 63.0 in | Wt 239.5 lb

## 2021-04-16 DIAGNOSIS — H9201 Otalgia, right ear: Secondary | ICD-10-CM | POA: Diagnosis not present

## 2021-04-16 DIAGNOSIS — R748 Abnormal levels of other serum enzymes: Secondary | ICD-10-CM

## 2021-04-16 DIAGNOSIS — Z1212 Encounter for screening for malignant neoplasm of rectum: Secondary | ICD-10-CM

## 2021-04-16 DIAGNOSIS — R7309 Other abnormal glucose: Secondary | ICD-10-CM

## 2021-04-16 DIAGNOSIS — Z1211 Encounter for screening for malignant neoplasm of colon: Secondary | ICD-10-CM | POA: Diagnosis not present

## 2021-04-16 DIAGNOSIS — Z01419 Encounter for gynecological examination (general) (routine) without abnormal findings: Secondary | ICD-10-CM

## 2021-04-16 DIAGNOSIS — F419 Anxiety disorder, unspecified: Secondary | ICD-10-CM

## 2021-04-16 DIAGNOSIS — E785 Hyperlipidemia, unspecified: Secondary | ICD-10-CM

## 2021-04-16 LAB — HEMOCCULT GUIAC POC 1CARD (OFFICE): Fecal Occult Blood, POC: NEGATIVE

## 2021-04-16 MED ORDER — ESCITALOPRAM OXALATE 10 MG PO TABS: 10.0000 mg | ORAL_TABLET | Freq: Every day | ORAL | 12 refills | Status: DC

## 2021-04-16 MED ORDER — CEPHALEXIN 500 MG PO CAPS: 500.0000 mg | ORAL_CAPSULE | Freq: Four times a day (QID) | ORAL | 0 refills | Status: DC

## 2021-04-16 NOTE — Progress Notes (Signed)
Patient ID: Margaret [name], female   DOB: 30-Apr-1971, 50 y.o.   MRN: [id number] History of Present Illness: Margaret [name] is a 50 year old white female, married, [id number], in for a well woman gyn exam.  Lab Results  Component Value Date   DIAGPAP  10/28/2019    - Negative for intraepithelial lesion or malignancy (NILM)   [place] Negative 10/28/2019    She has pain in right ear, and says her back is feeling better, got muscle relaxer from PCP. She had labs last week. PCP is Dr Donia Guiles.  Current Medications, Allergies, Past Medical History, Past Surgical History, Family History and Social History were reviewed in [employer] record.     Review of Systems: Patient denies any headaches, hearing loss, fatigue, blurred vision, shortness of breath, chest pain, abdominal pain, problems with bowel movements, urination, or intercourse. No joint pain or mood swings.     Physical Exam:BP 131/86 (BP Location: Left Arm, Patient Position: Sitting, Cuff Size: Large)   Pulse 82   Ht 5\' 3"  (1.6 m)   Wt 239 lb 8 oz (108.6 kg)   BMI 42.43 kg/m   General:  Well developed, well nourished, no acute distress Skin:  Warm and dry Neck:  Midline trachea, normal thyroid, good ROM, no lymphadenopathy,right ear red, and left ear slightly red, has light reflex on TM. Lungs; Clear to auscultation bilaterally Breast:  No dominant palpable mass, retraction, or nipple discharge Cardiovascular: Regular rate and rhythm Abdomen:  Soft, non tender, no hepatosplenomegaly Pelvic:  External genitalia is normal in appearance, no lesions.  The vagina is normal in appearance. Urethra has no lesions or masses. The cervix is bulbous.  Uterus is felt to be normal size, shape, and contour.  No adnexal masses or tenderness noted.Bladder is non tender, no masses felt. Rectal: Good sphincter tone, no polyps, or hemorrhoids felt.  Hemoccult negative. Extremities/musculoskeletal:  No swelling or varicosities noted, no  clubbing or cyanosis Psych:  No mood changes, alert and cooperative,seems happy AA is 4 Fall risk is low Depression screen [facility] 2/9 04/16/2021 10/28/2019 07/08/2018  Decreased Interest 0 0 0  Down, Depressed, Hopeless 0 0 0  PHQ - 2 Score 0 0 0  Altered sleeping 0 - -  Tired, decreased energy 1 - -  Change in appetite 1 - -  Feeling bad or failure about yourself  0 - -  Trouble concentrating 0 - -  Moving slowly or fidgety/restless 0 - -  Suicidal thoughts 0 - -  PHQ-9 Score 2 - -    GAD 7 : Generalized Anxiety Score 04/16/2021  Nervous, Anxious, on Edge 1  Control/stop worrying 0  Worry too much - different things 1  Trouble relaxing 0  Restless 0  Easily annoyed or irritable 0  Afraid - awful might happen 0  Total GAD 7 Score 2      Upstream - 04/16/21 1453       Pregnancy Intention Screening   Does the patient want to become pregnant in the next year? N/A    Does the patient's partner want to become pregnant in the next year? N/A    Would the patient like to discuss contraceptive options today? N/A      Contraception Wrap Up   Current Method No Method - Other Reason   ablation   End Method No Method - Other Reason   ablation   Contraception Counseling Provided No  Examination chaperoned by Levy Pupa LPN  A1 c is 6.4 she has started [employer]. AST 134 ALT 115 TC 243  Triglycerides 169 HDL45 LDL 167     Impression and plan: 1. Encounter for well woman exam with routine gynecological exam Physical in 1 year Pap 2024  Mammogram yearly   2. Encounter for screening fecal occult blood testing  - POCT occult blood stool  3. Right ear pain Will rx keflex, keep out of water   4. Elevated liver enzymes Will labs and decrease alcohol  - Comprehensive metabolic panel - Iron, TIBC and Ferritin Panel - Hepatitis, Acute Will talk when labs back  5. Elevated hemoglobin A1c Recheck in 3-6 months after doing [employer]  6. Dyslipidemia Recheck in 3-6 months   Try to walk   7. Anxiety Will refill lexapro   Meds ordered this encounter  Medications   cephALEXin (KEFLEX) 500 MG capsule    Sig: Take 1 capsule (500 mg total) by mouth 4 (four) times daily.    Dispense:  28 capsule    Refill:  0    Order Specific Question:   Supervising Provider    Answer:   Elonda Husky, LUTHER H [[id number]]   escitalopram (LEXAPRO) 10 MG tablet    Sig: Take 1 tablet (10 mg total) by mouth daily.    Dispense:  30 tablet    Refill:  12    Order Specific Question:   Supervising Provider    Answer:   Elonda Husky, LUTHER H [[id number]]      8. Screening for colorectal cancer Referred to dr Gala Romney for colonoscopy  - Ambulatory referral to Gastroenterology

## 2021-04-17 ENCOUNTER — Encounter: Payer: Self-pay | Admitting: Internal Medicine

## 2021-04-17 LAB — COMPREHENSIVE METABOLIC PANEL
ALT: 93 IU/L — ABNORMAL HIGH (ref 0–32)
AST: 79 IU/L — ABNORMAL HIGH (ref 0–40)
Albumin/Globulin Ratio: 1.8 (ref 1.2–2.2)
Albumin: 4.6 g/dL (ref 3.8–4.8)
Alkaline Phosphatase: 84 IU/L (ref 44–121)
BUN/Creatinine Ratio: 22 (ref 9–23)
BUN: 13 mg/dL (ref 6–24)
Bilirubin Total: 0.5 mg/dL (ref 0.0–1.2)
CO2: 24 mmol/L (ref 20–29)
Calcium: 9.3 mg/dL (ref 8.7–10.2)
Chloride: 102 mmol/L (ref 96–106)
Creatinine, Ser: 0.59 mg/dL (ref 0.57–1.00)
Globulin, Total: 2.6 g/dL (ref 1.5–4.5)
Glucose: 102 mg/dL — ABNORMAL HIGH (ref 65–99)
Potassium: 4.8 mmol/L (ref 3.5–5.2)
Sodium: 140 mmol/L (ref 134–144)
Total Protein: 7.2 g/dL (ref 6.0–8.5)
eGFR: 110 mL/min/{1.73_m2} (ref >59)

## 2021-04-17 LAB — IRON,TIBC AND FERRITIN PANEL
Ferritin: 105 ng/mL (ref 15–150)
Iron Saturation: 21 % (ref 15–55)
Iron: 75 ug/dL (ref 27–159)
Total Iron Binding Capacity: 352 ug/dL (ref 250–450)
UIBC: 277 ug/dL (ref 131–425)

## 2021-05-06 ENCOUNTER — Telehealth: Payer: Self-pay | Admitting: Adult Health

## 2021-05-06 DIAGNOSIS — R748 Abnormal levels of other serum enzymes: Secondary | ICD-10-CM

## 2021-05-06 NOTE — Telephone Encounter (Signed)
Recheck LFTs in next week or so, ha lost about 8-9 lbs doing weight watchers

## 2021-05-07 ENCOUNTER — Ambulatory Visit
Admission: RE | Admit: 2021-05-07 | Discharge: 2021-05-07 | Disposition: A | Discharge status: Home / Self Care | Payer: BC Managed Care – PPO | Source: Ambulatory Visit

## 2021-05-07 ENCOUNTER — Other Ambulatory Visit: Payer: Self-pay

## 2021-05-07 DIAGNOSIS — Z1231 Encounter for screening mammogram for malignant neoplasm of breast: Secondary | ICD-10-CM

## 2021-05-24 LAB — COMPREHENSIVE METABOLIC PANEL
ALT: 53 IU/L — ABNORMAL HIGH (ref 0–32)
AST: 46 IU/L — ABNORMAL HIGH (ref 0–40)
Albumin/Globulin Ratio: 1.6 (ref 1.2–2.2)
Albumin: 4.2 g/dL (ref 3.8–4.8)
Alkaline Phosphatase: 76 IU/L (ref 44–121)
BUN/Creatinine Ratio: 11 (ref 9–23)
BUN: 7 mg/dL (ref 6–24)
Bilirubin Total: 0.7 mg/dL (ref 0.0–1.2)
CO2: 24 mmol/L (ref 20–29)
Calcium: 9.8 mg/dL (ref 8.7–10.2)
Chloride: 99 mmol/L (ref 96–106)
Creatinine, Ser: 0.65 mg/dL (ref 0.57–1.00)
Globulin, Total: 2.6 g/dL (ref 1.5–4.5)
Glucose: 99 mg/dL (ref 65–99)
Potassium: 4.1 mmol/L (ref 3.5–5.2)
Sodium: 140 mmol/L (ref 134–144)
Total Protein: 6.8 g/dL (ref 6.0–8.5)
eGFR: 107 mL/min/{1.73_m2} (ref >59)

## 2021-05-27 ENCOUNTER — Other Ambulatory Visit (INDEPENDENT_AMBULATORY_CARE_PROVIDER_SITE_OTHER): Payer: BC Managed Care – PPO

## 2021-05-27 ENCOUNTER — Other Ambulatory Visit: Payer: Self-pay

## 2021-05-27 ENCOUNTER — Telehealth: Payer: Self-pay | Admitting: Adult Health

## 2021-05-27 DIAGNOSIS — R399 Unspecified symptoms and signs involving the genitourinary system: Secondary | ICD-10-CM | POA: Diagnosis not present

## 2021-05-27 LAB — POCT URINALYSIS DIPSTICK OB
Glucose, UA: NEGATIVE
Ketones, UA: NEGATIVE
Leukocytes, UA: NEGATIVE
Nitrite, UA: POSITIVE
POC,PROTEIN,UA: NEGATIVE

## 2021-05-27 MED ORDER — PHENAZOPYRIDINE HCL 200 MG PO TABS: 200.0000 mg | ORAL_TABLET | Freq: Three times a day (TID) | ORAL | 0 refills | Status: DC | PRN

## 2021-05-27 MED ORDER — SULFAMETHOXAZOLE-TRIMETHOPRIM 800-160 MG PO TABS: 1.0000 | ORAL_TABLET | Freq: Two times a day (BID) | ORAL | 0 refills | Status: DC

## 2021-05-27 NOTE — Progress Notes (Signed)
Chart reviewed for nurse visit. Agree with plan of care. Pt has pain when pees, and urgency and burning had blood when wipes, will rx septra ds and pyridium Estill Dooms, NP 05/27/2021 4:53 PM

## 2021-05-27 NOTE — Telephone Encounter (Signed)
Pt has pain when pees, and urgency and burning had blood when wipes, will rx septra ds and pyridium

## 2021-05-27 NOTE — Progress Notes (Signed)
   NURSE VISIT- UTI SYMPTOMS   SUBJECTIVE:  Margaret [name] is a 50 y.o. [id number] female here for UTI symptoms. She is a GYN patient. She reports flank pain bilaterally, hematuria, urinary frequency, and urinary urgency.  OBJECTIVE:  There were no vitals taken for this visit.  Appears well, in no apparent distress  No results found for this or any previous visit (from the past 24 hour(s)).  ASSESSMENT: GYN patient with UTI symptoms and positive nitrites  PLAN: Note routed to Derrek Monaco, AGNP   Rx sent by provider today: No Urine culture sent Call or return to clinic prn if these symptoms worsen or fail to improve as anticipated. Follow-up: as needed   Jacia Sickman A Jediah Horger  05/27/2021 4:38 PM

## 2021-05-29 ENCOUNTER — Telehealth: Payer: Self-pay

## 2021-05-29 LAB — SPECIMEN STATUS REPORT

## 2021-05-29 LAB — URINE CULTURE

## 2021-05-29 NOTE — Telephone Encounter (Signed)
Called pt's pharmacy to inquire about PA for Pyridium. They stated that it was non-FDA approved and gave no alternatives.

## 2021-05-29 NOTE — Telephone Encounter (Signed)
Spoke with Margaret [name] at [facility], pyridium not covered, so tell Cristina can use AZO or cash price

## 2021-05-30 LAB — SPECIMEN STATUS REPORT

## 2021-07-08 ENCOUNTER — Other Ambulatory Visit: Payer: Self-pay

## 2021-07-08 MED ORDER — ALPRAZOLAM 0.5 MG PO TABS: ORAL_TABLET | ORAL | 0 refills | Status: DC

## 2021-08-16 ENCOUNTER — Other Ambulatory Visit: Payer: Self-pay | Admitting: Adult Health

## 2021-08-17 NOTE — Progress Notes (Signed)
Referring Provider: Estill Dooms, NP Primary Care Physician:  Caryl Bis, MD Primary Gastroenterologist:  Dr. Gala Romney  Chief Complaint  Patient presents with   consult TCS    Last TCS 10 yrs ago, father hx polyps     HPI:   Margaret [name] is a 50 y.o. female presenting today at the request of Estill Dooms, NP for colon cancer screening.  Recommended office visit due to BMI.  Last colonoscopy in April 2014 with Dr. Arlana Pouch with [facility], normal exam per limited report in [place]. Patient reports she was having some bleeding at that time. Only with hemorrhoids. Reports father had history of polyps. Not aware of any advanced polyps/cancer. No FHx of colon cancer.  GI concerns today.  Denies abdominal pain, constipation, diarrhea, bright red blood per rectum, melena, nausea, vomiting, dysphagia, reflux symptoms.  I do note that she has had elevated ALT dating back to 2018.  Acute bump in AST and ALT in [place] 2022 with AST 134, ALT 115.  Patient reports this was after a trip to the beach for senior week. Did a lot of drinking. Most recent labs 05/23/2021 with AST 46, ALT 53.    Currently, alcohol on the weekends: About 3 glasses of wine.  No history of drug use.  No regular use of tylenol. Was taking some in August when she hurt her back.  Multi-vitamin and vitamin D.  No known exposures to hepatitis.  Father had hepatitis not sure what type.  No new medications.  No prior blood transfusion.   Intentional 15 lb weight loss since August with Weight Wat[employer].   Intermittent swelling in her left lower extremity.  History of surgery in left ankle with pins and plate in place.  No other peripheral edema.  Denies abdominal distention, jaundice, bruising, bleeding, changes in mental status.  Past Medical History:  Diagnosis Date   Anxiety 03/07/2014   Boil 3/84/6659   Complication of anesthesia    Severe itching after spinal (C-Section)   Dyslipidemia 05/21/2015    Elevated blood sugar 05/21/2015   Hemorrhoids    Hot flashes 02/15/2015   Obesity    Peri-menopause 02/15/2015   Skin cancer 01/2017   left leg    Past Surgical History:  Procedure Laterality Date   ANKLE FRACTURE SURGERY     CESAREAN SECTION     COLONOSCOPY  2014   Dr. Arlana Pouch;  [facility]; normal exam, hemorrhoids per patient.   ORIF ANKLE FRACTURE  03/11/2012   Procedure: OPEN REDUCTION INTERNAL FIXATION (ORIF) ANKLE FRACTURE;  Surgeon: Carole Civil, MD;  Location: [facility];  Service: Orthopedics;  Laterality: Left;   Skin cancer removed      Current Outpatient Medications  Medication Sig Dispense Refill   ALPRAZolam (XANAX) 0.5 MG tablet TAKE 1 TABLET BY MOUTH EVERY TWELVE HOURS AS NEEDED FOR ANXIETY 30 tablet 0   Cholecalciferol (VITAMIN D3) 1000 units CAPS Take by mouth. Takes 2 daily     escitalopram (LEXAPRO) 10 MG tablet Take 1 tablet (10 mg total) by mouth daily. 30 tablet 12   Multiple Vitamin (MULTIVITAMIN) tablet Take 1 tablet by mouth daily.     naproxen (NAPROSYN) 500 MG tablet Take 500 mg by mouth 2 (two) times daily with a meal.     VITAMIN D PO Take by mouth.     No current facility-administered medications for this visit.    Allergies as of 08/19/2021   (No Known Allergies)  Family History  Problem Relation Age of Onset   Hyperlipidemia Mother    Heart disease Father    Diabetes Father    Kidney disease Father    Colon polyps Father    Cancer Maternal Grandmother        breast   Heart disease Maternal Grandmother    Breast cancer Maternal Grandmother    Cancer Maternal Grandfather        bone   Breast cancer Paternal Grandmother    Cancer Paternal Grandfather    Cancer Maternal Aunt 55       breast    Breast cancer Maternal Aunt    Other Maternal Aunt        brain tumor   Breast cancer Maternal Aunt    Anesthesia problems Neg Hx    Hypotension Neg Hx    Malignant hyperthermia Neg Hx    Pseudochol deficiency Neg Hx    Colon cancer Neg Hx      Social History   Socioeconomic History   Marital status: Married    Spouse name: Not on file   Number of children: Not on file   Years of education: Not on file   Highest education level: Not on file  Occupational History   Not on file  Tobacco Use   Smoking status: Former    Packs/day: 0.50    Years: 22.00    Pack years: 11.00    Types: Cigarettes    Quit date: 12/22/2011    Years since quitting: 9.6   Smokeless tobacco: Never   Tobacco comments:    pt uses nicotine vap.   Vaping Use   Vaping Use: Every day  Substance and Sexual Activity   Alcohol use: Yes    Alcohol/week: 3.0 standard drinks    Types: 3 Glasses of wine per week    Comment: weekends   Drug use: Never   Sexual activity: Yes    Birth control/protection: Surgical    Comment: ablation  Other Topics Concern   Not on file  Social History Narrative   Not on file   Social Determinants of Health   Financial Resource Strain: Low Risk    Difficulty of Paying Living Expenses: Not hard at all  Food Insecurity: No Food Insecurity   Worried About Charity fundraiser in the Last Year: Never true   Ran Out of Food in the Last Year: Never true  Transportation Needs: No Transportation Needs   Lack of Transportation (Medical): No   Lack of Transportation (Non-Medical): No  Physical Activity: Insufficiently Active   Days of Exercise per Week: 2 days   Minutes of Exercise per Session: 20 min  Stress: Stress Concern Present   Feeling of Stress : To some extent  Social Connections: Socially Integrated   Frequency of Communication with Friends and Family: Three times a week   Frequency of Social Gatherings with Friends and Family: Once a week   Attends Religious Services: 1 to 4 times per year   Active Member of [employer] or Organizations: Yes   Attends Archivist Meetings: 1 to 4 times per year   Marital Status: Married  Human resources officer Violence: Not At Risk   Fear of Current or Ex-Partner: No    Emotionally Abused: No   Physically Abused: No   Sexually Abused: No    Review of Systems: Gen: Denies any fever, chills, cold or flulike symptoms, presyncope, syncope. CV: Denies chest pain, heart palpitations. Resp: Denies shortness of  breath or cough. GI: See HPI GU : Denies urinary burning, urinary frequency, urinary hesitancy MS: Denies joint pain. Derm: Denies rash. Heme: See HPI  Physical Exam: BP 131/88   Pulse 84   Temp (!) 97.1 F (36.2 C)   Ht 5\' 3"  (1.6 m)   Wt 230 lb 12.8 oz (104.7 kg)   BMI 40.88 kg/m  General:   Alert and oriented. Pleasant and cooperative. Well-nourished and well-developed.  Head:  Normocephalic and atraumatic. Eyes:  Without icterus, sclera clear and conjunctiva pink.  Ears:  Normal auditory acuity. Lungs:  Clear to auscultation bilaterally. No wheezes, rales, or rhonchi. No distress.  Heart:  S1, S2 present without murmurs appreciated.  Abdomen:  +BS, soft, non-tender and non-distended. No HSM noted. No guarding or rebound. No masses appreciated.  Rectal:  Deferred  Msk:  Symmetrical without gross deformities. Normal posture. Extremities:  Without edema. Neurologic:  Alert and  oriented x4;  grossly normal neurologically. Skin:  Intact without significant lesions or rashes. Psych: Normal mood and affect.    Assessment: 50 year old female with history of anxiety and obesity presenting today to discuss scheduling screening colonoscopy.  Also discussed elevated LFTs.  Colon cancer screening: Last colonoscopy in 2014 with Dr. Arlana Pouch with [facility].  Per limited report in care everywhere, appears patient had a normal exam.  Patient reports she was only found to have hemorrhoids.  Denies any significant GI symptoms.  No alarm symptoms.  Father has history of colon polyps, but no nonadvanced adenoma.  No family history of colon cancer.  Per guidelines, next colonoscopy should be in 2024.  We will request colonoscopy report to confirm normal  exam.  Elevated LFTs:  Mildly elevated ALT dates back to 2018.  Acute bump in AST and ALT in [place] 2022 with AST 134, ALT 115.  This was after a trip to the beach where patient drank more alcohol than usual.  Most recent labs August 2022 with AST 46, ALT 53.  No additional evaluation has been completed.  Currently drinking about 3 glasses of wine on the weekends, no history of illicit drug use, no regular Tylenol use, no concerning OTC supplements or prescriptive medications.  Father with history of hepatitis, no other family history of liver disease.  She has no signs or symptoms of decompensated liver disease and has intentionally lost 15 pounds with weight watchers since August. Overall, suspect elevation may be due to alcohol and/or fatty liver; however, we will complete basic evaluation to rule out Hep B and Hep C, screen for hemochromatosis, check INR, and abdominal ultrasound.   Plan: HFP, hepatitis B surface antibody, hepatitis B core antibody, hepatitis B surface antigen, hepatitis C antibody, iron panel with ferritin, INR. Abdominal ultrasound. Avoid alcohol for now while working with elevated liver enzymes. Continue to limit Tylenol.  No more than 2000 mg/day if needed. May continue multivitamin and vitamin D, but avoid other over-the-counter supplements. Congratulated on weight loss and encouraged to continue with this. Next colonoscopy in 2024.  We will request outside colonoscopy records to confirm this. Further recommendations to follow blood work and ultrasound.   Aliene Altes, PA-C [facility] 08/19/2021

## 2021-08-19 ENCOUNTER — Encounter: Payer: Self-pay | Admitting: Gastroenterology

## 2021-08-19 ENCOUNTER — Ambulatory Visit: Payer: BC Managed Care – PPO | Admitting: Gastroenterology

## 2021-08-19 ENCOUNTER — Other Ambulatory Visit: Payer: Self-pay

## 2021-08-19 VITALS — BP 131/88 | HR 84 | Temp 97.1°F | Ht 63.0 in | Wt 230.8 lb

## 2021-08-19 DIAGNOSIS — Z1211 Encounter for screening for malignant neoplasm of colon: Secondary | ICD-10-CM | POA: Insufficient documentation

## 2021-08-19 DIAGNOSIS — R7989 Other specified abnormal findings of blood chemistry: Secondary | ICD-10-CM

## 2021-08-19 NOTE — Patient Instructions (Addendum)
Please have blood work completed at [facility] in the next week.  Please fast for this blood work.   We also arrange for you to have an ultrasound of your abdomen at [facility].  Please avoid all alcohol for now while we are working up your elevated liver enzymes.  Limit Tylenol.  If needed, no more than 2000 mg/day.  You may continue your multivitamin and vitamin D, but avoid all other over-the-counter supplements for now.  Congratulations on your weight loss thus far!  Continue with your efforts.  As we discussed, according to the records in our system, your next colonoscopy will be in 2024.  I will request your colonoscopy records to confirm this.  We will have further recommendations to follow blood work and ultrasound.    It was a pleasure meeting you today!   Aliene Altes, PA-C [facility]enter[facility]

## 2021-08-21 ENCOUNTER — Other Ambulatory Visit: Payer: Self-pay | Admitting: *Deleted

## 2021-08-21 ENCOUNTER — Telehealth: Payer: Self-pay | Admitting: Gastroenterology

## 2021-08-21 ENCOUNTER — Telehealth: Payer: Self-pay | Admitting: *Deleted

## 2021-08-21 DIAGNOSIS — R7989 Other specified abnormal findings of blood chemistry: Secondary | ICD-10-CM

## 2021-08-21 LAB — HEPATIC FUNCTION PANEL
AG Ratio: 1.5 (calc) (ref 1.0–2.5)
ALT: 28 U/L (ref 6–29)
AST: 28 U/L (ref 10–35)
Albumin: 4.1 g/dL (ref 3.6–5.1)
Alkaline phosphatase (APISO): 71 U/L (ref 37–153)
Bilirubin, Direct: 0.1 mg/dL (ref 0.0–0.2)
Globulin: 2.8 g/dL (calc) (ref 1.9–3.7)
Indirect Bilirubin: 0.5 mg/dL (calc) (ref 0.2–1.2)
Total Bilirubin: 0.6 mg/dL (ref 0.2–1.2)
Total Protein: 6.9 g/dL (ref 6.1–8.1)

## 2021-08-21 LAB — PROTIME-INR
INR: 1
Prothrombin Time: 10 s (ref 9.0–11.5)

## 2021-08-21 LAB — HEPATITIS B SURFACE ANTIGEN: Hepatitis B Surface Ag: NONREACTIVE

## 2021-08-21 LAB — HEPATITIS B CORE ANTIBODY, TOTAL: Hep B Core Total Ab: NONREACTIVE

## 2021-08-21 LAB — IRON,TIBC AND FERRITIN PANEL
%SAT: 18 % (calc) (ref 16–45)
Ferritin: 44 ng/mL (ref 16–232)
Iron: 63 ug/dL (ref 45–160)
TIBC: 347 mcg/dL (calc) (ref 250–450)

## 2021-08-21 LAB — HEPATITIS B SURFACE ANTIBODY,QUALITATIVE: Hep B S Ab: REACTIVE — AB

## 2021-08-21 NOTE — Telephone Encounter (Signed)
LMOM for pt to call office back 

## 2021-08-21 NOTE — Telephone Encounter (Signed)
Spoke to pt. She informed me that she would like to speak to the provider.

## 2021-08-21 NOTE — Telephone Encounter (Signed)
Received and reviewed colonoscopy report from [facility].  Exam date: 01/17/2013  Provider: Ezzie Dural, MD  Impression:  - One 4 mm polyp at the hepatic flexure.  Resected and retrieved. - One 3 mm polyp in the sigmoid colon.  Resected and retrieved. - Internal hemorrhoids. - The examination was otherwise normal.  Recommendations: Repeat colonoscopy in 10 years for screening purposes.  Pathology: 2 hyperplastic polyps.   Agree with recommendations.  Based on guidelines, next colonoscopy will be due in 2024.   Courtney: Please let patient know I did receive her colonoscopy records from 2014.  She had 2 small hyperplastic polyps.  These polyps are completely benign.  Her next colonoscopy will be due in 2024.  We will place her on recall for this.  Stacey: Please nic for colonoscopy in April 2024.

## 2021-08-21 NOTE — Telephone Encounter (Signed)
I would recommend she go ahead and have the [place] completed in light of chronically elevated LFTs previously. Want to verify whether or not she has any changes of her liver.   It is very encouraging that her liver enzymes have returned to normal! Continue to avoid alcohol and OTC supplements for now and keep up the good work with weight loss. I will review the rest of her labs as they become available.

## 2021-08-21 NOTE — Telephone Encounter (Signed)
Spoke with patient.  As liver enzymes have returned to normal, she is requesting to monitor for now.  She is agreeable to proceed with an ultrasound if her liver enzymes increase again.  We will plan to repeat HFP in 3 months.    Courtney, please arrange HFP to be completed in 3 months. Dx: Elevated LFTs.

## 2021-08-21 NOTE — Telephone Encounter (Signed)
Spoke to pt. Informed her of results and recommendations. Pt voiced understanding. Pt stated she seen lab results in [place] and they are with in normal limits. Wants to know if she still needs to have ultrasound on Monday 11/14.

## 2021-08-21 NOTE — Telephone Encounter (Signed)
Noted. Labs entered into Epic  °

## 2021-08-21 NOTE — Telephone Encounter (Signed)
Noted.   [facility] Clinical Pool: Please cancel [place].

## 2021-08-21 NOTE — Telephone Encounter (Signed)
Appt cancelled

## 2021-08-26 ENCOUNTER — Ambulatory Visit (HOSPITAL_COMMUNITY): Payer: BC Managed Care – PPO

## 2021-09-19 ENCOUNTER — Other Ambulatory Visit: Payer: Self-pay | Admitting: Adult Health

## 2021-10-18 ENCOUNTER — Other Ambulatory Visit: Payer: Self-pay | Admitting: Adult Health

## 2021-10-18 MED ORDER — SULFAMETHOXAZOLE-TRIMETHOPRIM 800-160 MG PO TABS: 1.0000 | ORAL_TABLET | Freq: Two times a day (BID) | ORAL | 0 refills | Status: DC

## 2021-10-18 MED ORDER — PHENAZOPYRIDINE HCL 200 MG PO TABS: 200.0000 mg | ORAL_TABLET | Freq: Three times a day (TID) | ORAL | 0 refills | Status: DC | PRN

## 2021-10-18 NOTE — Progress Notes (Signed)
Rx septra ds and pyridium for UTI

## 2021-11-01 ENCOUNTER — Other Ambulatory Visit: Payer: Self-pay | Admitting: Adult Health

## 2021-12-13 ENCOUNTER — Other Ambulatory Visit: Payer: Self-pay | Admitting: Adult Health

## 2022-01-06 ENCOUNTER — Other Ambulatory Visit: Payer: Self-pay | Admitting: Adult Health

## 2022-03-21 ENCOUNTER — Other Ambulatory Visit: Payer: Self-pay | Admitting: Adult Health

## 2022-04-01 ENCOUNTER — Other Ambulatory Visit: Payer: Self-pay | Admitting: Obstetrics & Gynecology

## 2022-04-01 DIAGNOSIS — Z1231 Encounter for screening mammogram for malignant neoplasm of breast: Secondary | ICD-10-CM

## 2022-05-02 ENCOUNTER — Other Ambulatory Visit: Payer: Self-pay | Admitting: Adult Health

## 2022-05-15 ENCOUNTER — Ambulatory Visit
Admission: RE | Admit: 2022-05-15 | Discharge: 2022-05-15 | Disposition: A | Discharge status: Home / Self Care | Payer: BC Managed Care – PPO | Source: Ambulatory Visit

## 2022-05-15 DIAGNOSIS — Z1231 Encounter for screening mammogram for malignant neoplasm of breast: Secondary | ICD-10-CM

## 2022-06-16 ENCOUNTER — Other Ambulatory Visit: Payer: Self-pay | Admitting: Adult Health

## 2022-06-27 ENCOUNTER — Ambulatory Visit: Payer: BC Managed Care – PPO | Admitting: Adult Health

## 2022-07-09 ENCOUNTER — Telehealth: Payer: Self-pay

## 2022-07-09 DIAGNOSIS — R748 Abnormal levels of other serum enzymes: Secondary | ICD-10-CM

## 2022-07-09 DIAGNOSIS — E785 Hyperlipidemia, unspecified: Secondary | ICD-10-CM

## 2022-07-09 DIAGNOSIS — R7309 Other abnormal glucose: Secondary | ICD-10-CM

## 2022-07-09 DIAGNOSIS — Z01419 Encounter for gynecological examination (general) (routine) without abnormal findings: Secondary | ICD-10-CM

## 2022-07-09 NOTE — Telephone Encounter (Signed)
Pt aware lab orders are in

## 2022-07-09 NOTE — Telephone Encounter (Signed)
Patient has an appointment on 07/14/2022 at 10:30 and would like to get her blood work done before her visit.  Would like to do it on Friday of this week if possible.

## 2022-07-11 LAB — COMPREHENSIVE METABOLIC PANEL WITH GFR
ALT: 45 IU/L — ABNORMAL HIGH (ref 0–32)
AST: 46 IU/L — ABNORMAL HIGH (ref 0–40)
Albumin/Globulin Ratio: 1.4 (ref 1.2–2.2)
Albumin: 4.3 g/dL (ref 3.8–4.9)
Alkaline Phosphatase: 76 IU/L (ref 44–121)
BUN/Creatinine Ratio: 12 (ref 9–23)
BUN: 9 mg/dL (ref 6–24)
Bilirubin Total: 0.5 mg/dL (ref 0.0–1.2)
CO2: 23 mmol/L (ref 20–29)
Calcium: 9.7 mg/dL (ref 8.7–10.2)
Chloride: 101 mmol/L (ref 96–106)
Creatinine, Ser: 0.76 mg/dL (ref 0.57–1.00)
Globulin, Total: 3.1 g/dL (ref 1.5–4.5)
Glucose: 126 mg/dL — ABNORMAL HIGH (ref 70–99)
Potassium: 4.5 mmol/L (ref 3.5–5.2)
Sodium: 139 mmol/L (ref 134–144)
Total Protein: 7.4 g/dL (ref 6.0–8.5)
eGFR: 95 mL/min/1.73

## 2022-07-11 LAB — LIPID PANEL
Chol/HDL Ratio: 6.3 ratio — ABNORMAL HIGH (ref 0.0–4.4)
Cholesterol, Total: 246 mg/dL — ABNORMAL HIGH (ref 100–199)
HDL: 39 mg/dL — ABNORMAL LOW (ref >39)
LDL Chol Calc (NIH): 169 mg/dL — ABNORMAL HIGH (ref 0–99)
Triglycerides: 201 mg/dL — ABNORMAL HIGH (ref 0–149)
VLDL Cholesterol Cal: 38 mg/dL (ref 5–40)

## 2022-07-11 LAB — CBC
Hematocrit: 43.9 % (ref 34.0–46.6)
Hemoglobin: 14.8 g/dL (ref 11.1–15.9)
MCH: 31 pg (ref 26.6–33.0)
MCHC: 33.7 g/dL (ref 31.5–35.7)
MCV: 92 fL (ref 79–97)
Platelets: 293 10*3/uL (ref 150–450)
RBC: 4.77 x10E6/uL (ref 3.77–5.28)
RDW: 12.6 % (ref 11.7–15.4)
WBC: 6.5 10*3/uL (ref 3.4–10.8)

## 2022-07-11 LAB — TSH: TSH: 1.62 u[IU]/mL (ref 0.450–4.500)

## 2022-07-11 LAB — HEMOGLOBIN A1C
Est. average glucose Bld gHb Est-mCnc: 137 mg/dL
Hgb A1c MFr Bld: 6.4 % — ABNORMAL HIGH (ref 4.8–5.6)

## 2022-07-14 ENCOUNTER — Other Ambulatory Visit (HOSPITAL_COMMUNITY)
Admission: RE | Admit: 2022-07-14 | Discharge: 2022-07-14 | Disposition: A | Discharge status: Home / Self Care | Payer: BC Managed Care – PPO | Source: Ambulatory Visit | Attending: Adult Health | Admitting: Adult Health

## 2022-07-14 ENCOUNTER — Ambulatory Visit (INDEPENDENT_AMBULATORY_CARE_PROVIDER_SITE_OTHER): Payer: BC Managed Care – PPO | Admitting: Adult Health

## 2022-07-14 ENCOUNTER — Encounter: Payer: Self-pay | Admitting: Adult Health

## 2022-07-14 VITALS — BP 135/87 | HR 89 | Ht 63.25 in | Wt 224.0 lb

## 2022-07-14 DIAGNOSIS — Z01419 Encounter for gynecological examination (general) (routine) without abnormal findings: Secondary | ICD-10-CM | POA: Insufficient documentation

## 2022-07-14 DIAGNOSIS — Z1211 Encounter for screening for malignant neoplasm of colon: Secondary | ICD-10-CM

## 2022-07-14 DIAGNOSIS — F419 Anxiety disorder, unspecified: Secondary | ICD-10-CM | POA: Diagnosis not present

## 2022-07-14 DIAGNOSIS — E785 Hyperlipidemia, unspecified: Secondary | ICD-10-CM

## 2022-07-14 DIAGNOSIS — R7309 Other abnormal glucose: Secondary | ICD-10-CM

## 2022-07-14 DIAGNOSIS — R748 Abnormal levels of other serum enzymes: Secondary | ICD-10-CM

## 2022-07-14 LAB — HEMOCCULT GUIAC POC 1CARD (OFFICE): Fecal Occult Blood, POC: NEGATIVE

## 2022-07-14 NOTE — Progress Notes (Signed)
Patient ID: Margaret [name], female   DOB: [birth year], 51 y.o.   MRN: [id number] History of Present Illness: Margaret [name] is a 51 year old white female, married, [id number], in for a well woman gyn exam and pap, and review labs. She had been to [place] for daughter's pre wedding party and then got COVID. Christen Bame is getting married 08/02/22.  She is going to [facility] weight loss and has lost 15 lbs.   PCP is Dr Quillian Quince.   Current Medications, Allergies, Past Medical History, Past Surgical History, Family History and Social History were reviewed in [employer] record.     Review of Systems: Patient denies any headaches, hearing loss, fatigue, blurred vision, shortness of breath, chest pain, abdominal pain, problems with bowel movements, urination, or intercourse. No joint pain or mood swings.     Physical Exam:BP 135/87 (BP Location: Left Arm, Patient Position: Sitting, Cuff Size: Normal)   Pulse 89   Ht 5' 3.25" (1.607 m)   Wt 224 lb (101.6 kg)   BMI 39.37 kg/m   General:  Well developed, well nourished, no acute distress Skin:  Warm and dry Neck:  Midline trachea, normal thyroid, good ROM, no lymphadenopathy Lungs; Clear to auscultation bilaterally Breast:  No dominant palpable mass, retraction, or nipple discharge Cardiovascular: Regular rate and rhythm Abdomen:  Soft, non tender, no hepatosplenomegaly Pelvic:  External genitalia is normal in appearance, no lesions.  The vagina is normal in appearance. Urethra has no lesions or masses. The cervix is smooth, pap with HR HPV genotyping performed.  Uterus is felt to be normal size, shape, and contour.  No adnexal masses or tenderness noted.Bladder is non tender, no masses felt. Rectal: Good sphincter tone, no polyps, or hemorrhoids felt.  Hemoccult negative. Extremities/musculoskeletal:  No swelling or varicosities noted, no clubbing or cyanosis Psych:  No mood changes, alert and cooperative,seems happy Reviewed labs.  AA is  3 Fall risk is low    07/14/2022   10:43 AM 04/16/2021    2:53 PM 10/28/2019    8:50 AM  Depression screen PHQ 2/9  Decreased Interest 0 0 0  Down, Depressed, Hopeless 0 0 0  PHQ - 2 Score 0 0 0  Altered sleeping 0 0   Tired, decreased energy 1 1   Change in appetite 1 1   Feeling bad or failure about yourself  0 0   Trouble concentrating 0 0   Moving slowly or fidgety/restless 0 0   Suicidal thoughts 0 0   PHQ-9 Score 2 2        07/14/2022   10:43 AM 04/16/2021    2:54 PM  GAD 7 : Generalized Anxiety Score  Nervous, Anxious, on Edge 0 1  Control/stop worrying 0 0  Worry too much - different things 1 1  Trouble relaxing 0 0  Restless 0 0  Easily annoyed or irritable 0 0  Afraid - awful might happen 0 0  Total GAD 7 Score 1 2    Upstream - 07/14/22 1040       Pregnancy Intention Screening   Does the patient want to become pregnant in the next year? N/A    Does the patient's partner want to become pregnant in the next year? N/A    Would the patient like to discuss contraceptive options today? N/A      Contraception Wrap Up   Current Method No Method - Other Reason   ablation   End Method No Method - Other Reason  ablation   Contraception Counseling Provided No            Examination chaperoned by Levy Pupa LPN    Impression and Plan 1. Encounter for gynecological examination with Papanicolaou smear of cervix Pap sent Pap in 3 years if normal Physical in 1 year Mammogram was normal 05/15/22 Colonoscopy due next year - Cytology - PAP( [place])  2. Encounter for screening fecal occult blood testing Hemoccult was negative  - POCT occult blood stool  3. Anxiety She is on lexapro 10 mg daily and has refills She has xanax prn   4. Dyslipidemia Will recheck 10/10/22 fasting, orders given to her Continue weight loss efforts  - Lipid panel  5. Elevated hemoglobin A1c A1c was 6.4 for last 2 checks, will recheck in 3 months  - Hemoglobin A1c  6.  Elevated liver enzymes Decrease alcohol and tylenol, recheck in 3 months  - Comprehensive metabolic panel

## 2022-07-15 LAB — CYTOLOGY - PAP
Adequacy: ABSENT
Comment: NEGATIVE
Diagnosis: NEGATIVE
High risk HPV: NEGATIVE

## 2022-08-27 ENCOUNTER — Other Ambulatory Visit: Payer: Self-pay | Admitting: Adult Health

## 2022-09-29 ENCOUNTER — Other Ambulatory Visit: Payer: Self-pay | Admitting: Adult Health

## 2022-10-03 LAB — HEMOGLOBIN A1C
Est. average glucose Bld gHb Est-mCnc: 137 mg/dL
Hgb A1c MFr Bld: 6.4 % — ABNORMAL HIGH (ref 4.8–5.6)

## 2022-10-03 LAB — LIPID PANEL
Chol/HDL Ratio: 5.3 ratio — ABNORMAL HIGH (ref 0.0–4.4)
Cholesterol, Total: 245 mg/dL — ABNORMAL HIGH (ref 100–199)
HDL: 46 mg/dL (ref >39)
LDL Chol Calc (NIH): 175 mg/dL — ABNORMAL HIGH (ref 0–99)
Triglycerides: 130 mg/dL (ref 0–149)
VLDL Cholesterol Cal: 24 mg/dL (ref 5–40)

## 2022-10-03 LAB — COMPREHENSIVE METABOLIC PANEL
ALT: 40 IU/L — ABNORMAL HIGH (ref 0–32)
AST: 45 IU/L — ABNORMAL HIGH (ref 0–40)
Albumin/Globulin Ratio: 1.7 (ref 1.2–2.2)
Albumin: 4.4 g/dL (ref 3.8–4.9)
Alkaline Phosphatase: 78 IU/L (ref 44–121)
BUN/Creatinine Ratio: 11 (ref 9–23)
BUN: 9 mg/dL (ref 6–24)
Bilirubin Total: 0.7 mg/dL (ref 0.0–1.2)
CO2: 24 mmol/L (ref 20–29)
Calcium: 10.2 mg/dL (ref 8.7–10.2)
Chloride: 103 mmol/L (ref 96–106)
Creatinine, Ser: 0.79 mg/dL (ref 0.57–1.00)
Globulin, Total: 2.6 g/dL (ref 1.5–4.5)
Glucose: 130 mg/dL — ABNORMAL HIGH (ref 70–99)
Potassium: 5.1 mmol/L (ref 3.5–5.2)
Sodium: 141 mmol/L (ref 134–144)
Total Protein: 7 g/dL (ref 6.0–8.5)
eGFR: 91 mL/min/{1.73_m2} (ref >59)

## 2022-11-10 ENCOUNTER — Other Ambulatory Visit: Payer: Self-pay | Admitting: Adult Health

## 2022-12-15 ENCOUNTER — Other Ambulatory Visit: Payer: Self-pay | Admitting: Adult Health

## 2023-01-04 ENCOUNTER — Emergency Department (HOSPITAL_COMMUNITY): Payer: BC Managed Care – PPO

## 2023-01-04 ENCOUNTER — Emergency Department (HOSPITAL_COMMUNITY)
Admission: EM | Admit: 2023-01-04 | Discharge: 2023-01-04 | Disposition: A | Discharge status: Home / Self Care | Payer: BC Managed Care – PPO | Attending: Emergency Medicine | Admitting: Emergency Medicine

## 2023-01-04 ENCOUNTER — Encounter (HOSPITAL_COMMUNITY): Payer: Self-pay

## 2023-01-04 DIAGNOSIS — W502XXA Accidental twist by another person, initial encounter: Secondary | ICD-10-CM | POA: Insufficient documentation

## 2023-01-04 DIAGNOSIS — S8261XA Displaced fracture of lateral malleolus of right fibula, initial encounter for closed fracture: Secondary | ICD-10-CM | POA: Diagnosis not present

## 2023-01-04 DIAGNOSIS — S8991XA Unspecified injury of right lower leg, initial encounter: Secondary | ICD-10-CM | POA: Diagnosis present

## 2023-01-04 DIAGNOSIS — S82891A Other fracture of right lower leg, initial encounter for closed fracture: Secondary | ICD-10-CM

## 2023-01-04 MED ORDER — HYDROCODONE-ACETAMINOPHEN 5-325 MG PO TABS
1.0000 | ORAL_TABLET | Freq: Once | ORAL | Status: AC
  Administered 2023-01-04: 1 via ORAL
  Filled 2023-01-04: qty 1

## 2023-01-04 NOTE — ED Provider Notes (Signed)
[facility] Provider Note   CSN: [id number] Arrival date & time: 01/04/23  1713     History  Chief Complaint  Patient presents with   Ankle Injury    Margaret [name] is a 52 y.o. female.  Who presents to the ED for evaluation of right ankle pain.  She states she was walking up a ramp at her mother's house when she stepped on the ramp and concrete and fell.  She twisted her ankle and fell to her right side and landed on her right knee.  She did not hit her head or lose consciousness.  Does not take blood thinners.  Reported feeling a pop and had immediate pain.  Was unable to bear weight afterwards.  States she had a similar episode a few years ago and fractured her ankle requiring ORIF.  She denies numbness, weakness, tingling.  Denies pain to any other part of her body.  Has not taken anything for pain prior to arrival and rates it at a 6 out of 10.   Ankle Injury       Home Medications Prior to Admission medications   Medication Sig Start Date End Date Taking? Authorizing Provider  ALPRAZolam Duanne Moron) 0.5 MG tablet TAKE 1 TABLET BY MOUTH EVERY TWELVE HOURS AS NEEDED FOR ANXIETY 12/15/22   Derrek Monaco A, NP  Cholecalciferol (VITAMIN D3) 1000 units CAPS Take by mouth. Takes 2 daily    [provider]  escitalopram (LEXAPRO) 10 MG tablet TAKE 1 TABLET BY MOUTH DAILY 05/02/22   Estill Dooms, NP  Multiple Vitamin (MULTIVITAMIN) tablet Take 1 tablet by mouth daily.    [provider]  naproxen (NAPROSYN) 500 MG tablet Take 500 mg by mouth as needed.    [provider]  VITAMIN D PO Take by mouth.    [provider]      Allergies    Patient has no known allergies.    Review of Systems   Review of Systems  Musculoskeletal:  Positive for arthralgias and joint swelling.  All other systems reviewed and are negative.   Physical Exam Updated Vital Signs BP (!) 149/98 (BP Location: Left Arm)    Pulse 94   Temp 97.6 F (36.4 C) (Oral)   Resp 18   Ht 5\' 4"  (1.626 m)   Wt 99.8 kg   SpO2 98%   BMI 37.76 kg/m  Physical Exam Vitals and nursing note reviewed.  Constitutional:      General: She is not in acute distress.    Appearance: Normal appearance. She is normal weight. She is not ill-appearing.  HENT:     Head: Normocephalic and atraumatic.  Pulmonary:     Effort: Pulmonary effort is normal. No respiratory distress.  Abdominal:     General: Abdomen is flat.  Musculoskeletal:        General: Normal range of motion.     Cervical back: Neck supple.     Comments: Swelling to the right lateral malleolus with TTP.  DP pulses 2+ bilaterally.  Sensation intact in all toes.  Slightly decreased active range of motion of the right ankle secondary to pain.  Full active range of motion of the right knee and hip.  Compartments soft.  Skin:    General: Skin is warm and dry.     Capillary Refill: Capillary refill takes less than 2 seconds.     Comments: Small abrasion to the inferior pole of the patella  on the right with no active bleeding  Neurological:     Mental Status: She is alert and oriented to person, place, and time.  Psychiatric:        Mood and Affect: Mood normal.        Behavior: Behavior normal.     ED Results / Procedures / Treatments   Labs (all labs ordered are listed, but only abnormal results are displayed) Labs Reviewed - No data to display  EKG None  Radiology No results found.  Procedures Procedures    Medications Ordered in ED Medications  HYDROcodone-acetaminophen (NORCO/VICODIN) 5-325 MG per tablet 1 tablet (has no administration in time range)    ED Course/ Medical Decision Making/ A&P                             Medical Decision Making Amount and/or Complexity of Data Reviewed Radiology: ordered.  Risk Prescription drug management.  This patient presents to the ED for concern of fall, right ankle pain, this involves an extensive  number of treatment options. The differential diagnosis includes fracture, strain, sprain, contusion  My initial workup includes pain control, x-ray right ankle Additional history obtained from: Nursing notes from this visit. Family daughter is present and provides a portion of the history  I ordered imaging studies including x-ray right ankle I independently visualized and interpreted imaging which showed minimally displaced fracture of the distal fibula I agree with the radiologist interpretation  Afebrile, hemodynamically stable.  52 year old female presents ED for evaluation of fall and right ankle pain.  On exam she has swelling to the lateral malleolus with tenderness to palpation.  Neurovascular status is intact.  X-ray reveals mildly angulated fracture of the distal aspect of the right fibula.  Patient was placed in a stirrup splint and will be nonweightbearing until orthopedic follow-up.  She was given contact information for on-call orthopedic surgeon.  She states she has a walker at home which she used when she fractured her left ankle and would prefer to use this over crutches.  She reports issues with constipation when taking opioids and would prefer to alternate Tylenol and ibuprofen at home for pain.  At this time there does not appear to be any evidence of an acute emergency medical condition and the patient appears stable for discharge with appropriate outpatient follow up. Diagnosis was discussed with patient who verbalizes understanding of care plan and is agreeable to discharge. I have discussed return precautions with patient who verbalizes understanding. Patient encouraged to follow-up with their PCP within 1 week. All questions answered.  Note: Portions of this report may have been transcribed using voice recognition software. Every effort was made to ensure accuracy; however, inadvertent computerized transcription errors may still be present.        Final Clinical  Impression(s) / ED Diagnoses Final diagnoses:  None    Rx / DC Orders ED Discharge Orders     None         Roylene Reason, Hershal Coria 01/04/23 1911    Fredia Sorrow, MD 01/06/23 [phone]

## 2023-01-04 NOTE — ED Triage Notes (Addendum)
Pt presents with a R ankle injury that happened by catching her ankle between a ramp and wall. Pt with obvious swelling noted in triage.

## 2023-01-04 NOTE — Discharge Instructions (Addendum)
You have been seen today for your complaint of right ankle pain. Your imaging showed a fracture of the right ankle. Your discharge medications include Alternate tylenol and ibuprofen for pain. You may alternate these every 4 hours. You may take up to 800 mg of ibuprofen at a time and up to 1000 mg of tylenol. Home care instructions are as follows:  Avoid weightbearing until you are able to follow-up with orthopedics Follow up with: Dr. Aline Brochure.  He is an Doctor, general practice.  Call tomorrow to schedule an appointment for a follow-up visit Please seek immediate medical care if you develop any of the following symptoms: You have severe pain that lasts. You develop new pain or swelling. Your skin or toenails below the injury turn blue or gray, feel cold, become numb, or are less sensitive to the touch. At this time there does not appear to be the presence of an emergent medical condition, however there is always the potential for conditions to change. Please read and follow the below instructions.  Do not take your medicine if  develop an itchy rash, swelling in your mouth or lips, or difficulty breathing; call 911 and seek immediate emergency medical attention if this occurs.  You may review your lab tests and imaging results in their entirety on your MyChart account.  Please discuss all results of fully with your primary care provider and other specialist at your follow-up visit.  Note: Portions of this text may have been transcribed using voice recognition software. Every effort was made to ensure accuracy; however, inadvertent computerized transcription errors may still be present.

## 2023-01-07 ENCOUNTER — Ambulatory Visit (INDEPENDENT_AMBULATORY_CARE_PROVIDER_SITE_OTHER): Payer: BC Managed Care – PPO | Admitting: Orthopedic Surgery

## 2023-01-07 ENCOUNTER — Encounter: Payer: Self-pay | Admitting: Orthopedic Surgery

## 2023-01-07 VITALS — Ht 64.0 in | Wt 220.0 lb

## 2023-01-07 DIAGNOSIS — S8264XA Nondisplaced fracture of lateral malleolus of right fibula, initial encounter for closed fracture: Secondary | ICD-10-CM

## 2023-01-07 NOTE — Progress Notes (Signed)
EMERGENCY ROOM FOLLOW UP  NEW PROBLEM/PATIENT   Patient ID: Margaret [name], female   DOB: 11/25/1970, 52 y.o.   MRN: [id number]   Emergency room record from (date) May 24 has been reviewed and this is included by reference and includes the review of systems with the following addition:   Chief Complaint  Patient presents with   Ankle Injury    01/04/23     HPI Margaret [name] is a 52 y.o. female.  This 52 year old female unfortunately fell and injured her right ankle fracturing her fibula.  She was seen in the emergency room where x-rays revealed she had a fibular fracture that is nondisplaced she was splinted and advised to get follow-up.  She complains of pain in the right fibula but has good range of motion and minimal swelling  HPI  No Known Allergies Current Outpatient Medications  Medication Instructions   ALPRAZolam (XANAX) 0.5 MG tablet TAKE 1 TABLET BY MOUTH EVERY TWELVE HOURS AS NEEDED FOR ANXIETY   Cholecalciferol (VITAMIN D3) 1000 units CAPS Oral, Takes 2 daily    escitalopram (LEXAPRO) 10 mg, Oral, Daily   Ibuprofen 200 MG CAPS    Multiple Vitamin (MULTIVITAMIN) tablet 1 tablet, Oral, Daily     Review of Systems Review of Systems chest pain none shortness of breath none skin laceration near fracture none   has a past medical history of Anxiety (03/07/2014), Boil ([id number]), Complication of anesthesia, Dyslipidemia (05/21/2015), Elevated blood sugar (05/21/2015), Hemorrhoids, Hot flashes (02/15/2015), Obesity, Peri-menopause (02/15/2015), and Skin cancer (01/2017).   Past Surgical History:  Procedure Laterality Date   ANKLE FRACTURE SURGERY     CESAREAN SECTION     COLONOSCOPY  2014   Dr. Arlana Pouch;  [facility]; normal exam, hemorrhoids per patient.   ENDOMETRIAL ABLATION  2012   ORIF ANKLE FRACTURE  03/11/2012   Procedure: OPEN REDUCTION INTERNAL FIXATION (ORIF) ANKLE FRACTURE;  Surgeon: Carole Civil, MD;  Location: [facility];  Service: Orthopedics;  Laterality: Left;   Skin  cancer removed      Family History  Problem Relation Age of Onset   Cancer Paternal Grandfather    Breast cancer Paternal Grandmother    Cancer Maternal Grandmother        breast   Heart disease Maternal Grandmother    Breast cancer Maternal Grandmother    Cancer Maternal Grandfather        bone   Heart disease Father    Diabetes Father    Kidney disease Father    Colon polyps Father    Hyperlipidemia Mother    Cancer Maternal Aunt 77       breast    Breast cancer Maternal Aunt    Other Maternal Aunt        brain tumor   Breast cancer Maternal Aunt    Anesthesia problems Neg Hx    Hypotension Neg Hx    Malignant hyperthermia Neg Hx    Pseudochol deficiency Neg Hx    Colon cancer Neg Hx     Social History Social History   Tobacco Use   Smoking status: Former    Packs/day: 0.50    Years: 22.00    Additional pack years: 0.00    Total pack years: 11.00    Types: Cigarettes    Quit date: 12/22/2011    Years since quitting: 11.0   Smokeless tobacco: Never   Tobacco comments:    pt uses nicotine vap.   Vaping Use   Vaping Use:  Some days  Substance Use Topics   Alcohol use: Yes    Alcohol/week: 3.0 standard drinks of alcohol    Types: 3 Glasses of wine per week    Comment: weekends   Drug use: Never    No Known Allergies  Current Outpatient Medications  Medication Sig Dispense Refill   ALPRAZolam (XANAX) 0.5 MG tablet TAKE 1 TABLET BY MOUTH EVERY TWELVE HOURS AS NEEDED FOR ANXIETY 30 tablet 0   Cholecalciferol (VITAMIN D3) 1000 units CAPS Take by mouth. Takes 2 daily     escitalopram (LEXAPRO) 10 MG tablet TAKE 1 TABLET BY MOUTH DAILY 30 tablet 12   Ibuprofen 200 MG CAPS      Multiple Vitamin (MULTIVITAMIN) tablet Take 1 tablet by mouth daily.     No current facility-administered medications for this visit.    Physical Exam Ht 5\' 4"  (1.626 m)   Wt 220 lb (99.8 kg)   BMI 37.76 kg/m  Body mass index is 37.76 kg/m.  Well developed and well nourished    Alert and oriented x 3  Normal affect and mood   GAIT:  Abnormal gait today essentially nonweightbearing   (STRIMVS)  Right ankle  Skin normal  Tenderness lateral fibula  Range of motion she can get the foot plantigrade plantar flex 20 degrees  Instability drawer test compromised by pain but feels normal  Motor exam normal  Vascular exam normal pulse perfusion capillary refill color and temperature  Sensation normal   Data Reviewed IMAGING From THE ER AND THE REPORT ARE REVIEWED, MY INTERPRETATION OF THE IMAGE(S) IS : Nondisplaced fibular fracture  Assessment  Encounter Diagnosis  Name Primary?   Closed nondisplaced fracture of lateral malleolus of right fibula, initial encounter Yes     Plan   Recommend 7 to 10-day repeat imaging nonweightbearing status.  Patient advised fracture may move  Offered her a trial of weightbearing to see if the fracture would move understanding that if it did this would require surgery she declined and decided to go nonweightbearing   April 8 x-rays out of plaster right ankle  Arther Abbott, MD 01/07/2023 11:53 AM

## 2023-01-19 ENCOUNTER — Ambulatory Visit (INDEPENDENT_AMBULATORY_CARE_PROVIDER_SITE_OTHER): Payer: BC Managed Care – PPO | Admitting: Orthopedic Surgery

## 2023-01-19 ENCOUNTER — Other Ambulatory Visit (INDEPENDENT_AMBULATORY_CARE_PROVIDER_SITE_OTHER): Payer: BC Managed Care – PPO

## 2023-01-19 DIAGNOSIS — S8264XA Nondisplaced fracture of lateral malleolus of right fibula, initial encounter for closed fracture: Secondary | ICD-10-CM | POA: Diagnosis not present

## 2023-01-19 DIAGNOSIS — S8264XD Nondisplaced fracture of lateral malleolus of right fibula, subsequent encounter for closed fracture with routine healing: Secondary | ICD-10-CM

## 2023-01-19 NOTE — Progress Notes (Signed)
Chief Complaint  Patient presents with   Follow-up    Recheck on right ankle, DOI 01-04-23.    15 days post injury lateral malleolus fracture right ankle  X-rays show fracture stable position mortise intact  Other than some ecchymosis around her toes and ankle foot alignment looks good she is using the scooter nonweightbearing  Recommend cam walker nonweightbearing start some ankle range of motion out of the cam walker  X-ray in 2 weeks and then we can start weightbearing

## 2023-02-05 ENCOUNTER — Other Ambulatory Visit (INDEPENDENT_AMBULATORY_CARE_PROVIDER_SITE_OTHER): Payer: BC Managed Care – PPO

## 2023-02-05 ENCOUNTER — Ambulatory Visit (INDEPENDENT_AMBULATORY_CARE_PROVIDER_SITE_OTHER): Payer: BC Managed Care – PPO | Admitting: Orthopedic Surgery

## 2023-02-05 ENCOUNTER — Encounter: Payer: Self-pay | Admitting: *Deleted

## 2023-02-05 DIAGNOSIS — S8264XD Nondisplaced fracture of lateral malleolus of right fibula, subsequent encounter for closed fracture with routine healing: Secondary | ICD-10-CM

## 2023-02-05 NOTE — Progress Notes (Signed)
Chief Complaint  Patient presents with   Fracture    R ankle fx DOI 01-04-23   This is a follow-up visit status post right ankle fracture she has a closed nondisplaced fracture of the lateral malleolus this was sustained on March 24 she is now 32 days after injury  She has been in a cam walker nonweightbearing  X-ray today shows fracture stable healing ankle mortise intact  Encounter Diagnosis  Name Primary?   Closed nondisplaced fracture of lateral malleolus of right fibula with routine healing, subsequent encounter Yes    We elected to put her in an Aircast she will need something less bulky she can weight-bear as tolerated follow-up in a month

## 2023-02-20 ENCOUNTER — Other Ambulatory Visit: Payer: Self-pay | Admitting: Adult Health

## 2023-03-05 ENCOUNTER — Telehealth: Payer: Self-pay | Admitting: *Deleted

## 2023-03-05 NOTE — Telephone Encounter (Signed)
  Procedure: Colonoscopy  Estimated body mass index is 37.76 kg/m as calculated from the following:   Height as of 01/07/23: 5\' 4"  (1.626 m).   Weight as of 01/07/23: 220 lb (99.8 kg).  Have you had a colonoscopy before?  Yes (see epic telephone note 08/21/21)  Do you have family history of colon cancer?  no  Do you have a family history of polyps? Yes, father  Previous colonoscopy with polyps removed? yes  Do you have a history colorectal cancer?   no  Are you diabetic?  no  Do you have a prosthetic or mechanical heart valve? no  Do you have a pacemaker/defibrillator?   no  Have you had endocarditis/atrial fibrillation?  no  Do you use supplemental oxygen/CPAP?  no  Have you had joint replacement within the last 12 months?  no  Do you tend to be constipated or have to use laxatives?  no   Do you have history of alcohol use? If yes, how much and how often.  socially  Do you have history or are you using drugs? If yes, what do are you  using?  no  Have you ever had a stroke/heart attack?  no  Have you ever had a heart or other vascular stent placed,?no  Do you take weight loss medication? no  female patients,: have you had a hysterectomy? no                              are you post menopausal?  no                              do you still have your menstrual cycle? no    Date of last menstrual period?   Do you take any blood-thinning medications such as: (Plavix, aspirin, Coumadin, Aggrenox, Brilinta, Xarelto, Eliquis, Pradaxa, Savaysa or Effient)? no  If yes we need the name, milligram, dosage and who is prescribing doctor:               Current Outpatient Medications  Medication Sig Dispense Refill   ALPRAZolam (XANAX) 0.5 MG tablet TAKE 1 TABLET BY MOUTH EVERY TWELVE HOURS AS NEEDED FOR ANXIETY 30 tablet 0   escitalopram (LEXAPRO) 10 MG tablet TAKE 1 TABLET BY MOUTH DAILY 30 tablet 12   Cholecalciferol (VITAMIN D3) 1000 units CAPS Take by mouth. Takes 2 daily      Ibuprofen 200 MG CAPS      Multiple Vitamin (MULTIVITAMIN) tablet Take 1 tablet by mouth daily.     No current facility-administered medications for this visit.    No Known Allergies

## 2023-03-12 ENCOUNTER — Encounter: Payer: Self-pay | Admitting: Orthopedic Surgery

## 2023-03-12 ENCOUNTER — Other Ambulatory Visit (INDEPENDENT_AMBULATORY_CARE_PROVIDER_SITE_OTHER): Payer: BC Managed Care – PPO

## 2023-03-12 ENCOUNTER — Ambulatory Visit (INDEPENDENT_AMBULATORY_CARE_PROVIDER_SITE_OTHER): Payer: BC Managed Care – PPO | Admitting: Orthopedic Surgery

## 2023-03-12 VITALS — BP 141/90 | HR 84

## 2023-03-12 DIAGNOSIS — S8264XD Nondisplaced fracture of lateral malleolus of right fibula, subsequent encounter for closed fracture with routine healing: Secondary | ICD-10-CM

## 2023-03-12 NOTE — Progress Notes (Signed)
Chief Complaint  Patient presents with   Ankle Injury    Follow-up right ankle fracture   Encounter Diagnosis  Name Primary?   Closed nondisplaced fracture of lateral malleolus of right fibula with routine healing, subsequent encounter Yes    Date of injury March 24  Patient says the boot was causing her toes to her if she took it off she has been able to walk better without it; other than some mild swelling she seems to be doing well  Normal gait Normal range of motion right ankle no tenderness at the fracture Second and third digit MTP joint sore but normal range of motion  X-ray fracture healed mortise intact  Patient released

## 2023-03-16 ENCOUNTER — Other Ambulatory Visit: Payer: Self-pay | Admitting: Adult Health

## 2023-03-16 MED ORDER — PHENAZOPYRIDINE HCL 200 MG PO TABS: 200.0000 mg | ORAL_TABLET | Freq: Three times a day (TID) | ORAL | 0 refills | Status: DC | PRN

## 2023-03-16 MED ORDER — SULFAMETHOXAZOLE-TRIMETHOPRIM 800-160 MG PO TABS: 1.0000 | ORAL_TABLET | Freq: Two times a day (BID) | ORAL | 0 refills | Status: DC

## 2023-03-16 NOTE — Progress Notes (Signed)
Rx septra ds and pyridium

## 2023-03-25 ENCOUNTER — Other Ambulatory Visit: Payer: Self-pay | Admitting: Adult Health

## 2023-04-02 ENCOUNTER — Telehealth: Payer: Self-pay | Admitting: Adult Health

## 2023-04-02 NOTE — Telephone Encounter (Signed)
Left message that I have looked at compounded meds and I would not recommend it but check with Dr Reuel Boom, he may be more familiar

## 2023-04-06 MED ORDER — CLENPIQ 10-3.5-12 MG-GM -GM/175ML PO SOLN: 1.0000 | ORAL | 0 refills | Status: DC

## 2023-04-06 NOTE — Telephone Encounter (Signed)
Spoke with pt. Scheduled with Dr. Jena Gauss 7/25 at 8am. Aware will send rx for prep to pharmacy. Confirmed pharmacy. Also aware will send instructions via mychart. Nothing further needed.

## 2023-04-06 NOTE — Telephone Encounter (Signed)
Okay to schedule.  ASA 2. 

## 2023-04-06 NOTE — Addendum Note (Signed)
Addended by: Armstead Peaks on: 04/06/2023 11:05 AM   Modules accepted: Orders

## 2023-04-08 NOTE — Telephone Encounter (Signed)
Questionnaire from recall, no referral needed  

## 2023-04-14 ENCOUNTER — Other Ambulatory Visit: Payer: Self-pay | Admitting: Obstetrics & Gynecology

## 2023-04-14 DIAGNOSIS — Z1231 Encounter for screening mammogram for malignant neoplasm of breast: Secondary | ICD-10-CM

## 2023-05-05 ENCOUNTER — Other Ambulatory Visit: Payer: Self-pay | Admitting: Adult Health

## 2023-05-07 ENCOUNTER — Ambulatory Visit (HOSPITAL_COMMUNITY)
Admission: RE | Admit: 2023-05-07 | Discharge: 2023-05-07 | Disposition: A | Discharge status: Home / Self Care | Payer: BC Managed Care – PPO | Attending: Internal Medicine | Admitting: Internal Medicine

## 2023-05-07 ENCOUNTER — Encounter (HOSPITAL_COMMUNITY): Payer: Self-pay | Admitting: Internal Medicine

## 2023-05-07 ENCOUNTER — Encounter (HOSPITAL_COMMUNITY)
Admission: RE | Disposition: A | Discharge status: Home / Self Care | Payer: Self-pay | Source: Home / Self Care | Attending: Internal Medicine

## 2023-05-07 ENCOUNTER — Ambulatory Visit (HOSPITAL_COMMUNITY): Payer: BC Managed Care – PPO | Admitting: Anesthesiology

## 2023-05-07 ENCOUNTER — Other Ambulatory Visit: Payer: Self-pay

## 2023-05-07 DIAGNOSIS — Z1211 Encounter for screening for malignant neoplasm of colon: Secondary | ICD-10-CM | POA: Insufficient documentation

## 2023-05-07 DIAGNOSIS — Z87891 Personal history of nicotine dependence: Secondary | ICD-10-CM | POA: Diagnosis not present

## 2023-05-07 DIAGNOSIS — D127 Benign neoplasm of rectosigmoid junction: Secondary | ICD-10-CM | POA: Diagnosis not present

## 2023-05-07 DIAGNOSIS — F419 Anxiety disorder, unspecified: Secondary | ICD-10-CM | POA: Diagnosis not present

## 2023-05-07 DIAGNOSIS — K5730 Diverticulosis of large intestine without perforation or abscess without bleeding: Secondary | ICD-10-CM | POA: Diagnosis not present

## 2023-05-07 DIAGNOSIS — Z83719 Family history of colon polyps, unspecified: Secondary | ICD-10-CM | POA: Insufficient documentation

## 2023-05-07 DIAGNOSIS — K635 Polyp of colon: Secondary | ICD-10-CM | POA: Insufficient documentation

## 2023-05-07 DIAGNOSIS — D123 Benign neoplasm of transverse colon: Secondary | ICD-10-CM | POA: Insufficient documentation

## 2023-05-07 HISTORY — PX: POLYPECTOMY: SHX5525

## 2023-05-07 HISTORY — PX: COLONOSCOPY WITH PROPOFOL: SHX5780

## 2023-05-07 SURGERY — COLONOSCOPY WITH PROPOFOL
Anesthesia: General

## 2023-05-07 MED ORDER — LIDOCAINE HCL (CARDIAC) PF 100 MG/5ML IV SOSY
PREFILLED_SYRINGE | INTRAVENOUS | Status: DC | PRN
  Administered 2023-05-07: 80 mg via INTRAVENOUS

## 2023-05-07 MED ORDER — PHENYLEPHRINE 80 MCG/ML (10ML) SYRINGE FOR IV PUSH (FOR BLOOD PRESSURE SUPPORT)
PREFILLED_SYRINGE | INTRAVENOUS | Status: DC | PRN
  Administered 2023-05-07: 80 ug via INTRAVENOUS

## 2023-05-07 MED ORDER — PROPOFOL 500 MG/50ML IV EMUL
INTRAVENOUS | Status: DC | PRN
  Administered 2023-05-07: 150 ug/kg/min via INTRAVENOUS

## 2023-05-07 MED ORDER — PROPOFOL 10 MG/ML IV BOLUS
INTRAVENOUS | Status: DC | PRN
Start: 2023-05-07 — End: 2023-05-07
  Administered 2023-05-07: 100 mg via INTRAVENOUS

## 2023-05-07 MED ORDER — STERILE WATER FOR IRRIGATION IR SOLN
Status: DC | PRN
  Administered 2023-05-07: 240 mL

## 2023-05-07 MED ORDER — LACTATED RINGERS IV SOLN: INTRAVENOUS | Status: DC

## 2023-05-07 MED ORDER — LIDOCAINE HCL (PF) 2 % IJ SOLN
INTRAMUSCULAR | Status: AC
  Filled 2023-05-07: qty 10

## 2023-05-07 NOTE — Discharge Instructions (Signed)
  Colonoscopy Discharge Instructions  Read the instructions outlined below and refer to this sheet in the next few weeks. These discharge instructions provide you with general information on caring for yourself after you leave the hospital. Your doctor may also give you specific instructions. While your treatment has been planned according to the most current medical practices available, unavoidable complications occasionally occur. If you have any problems or questions after discharge, call Dr. Jena Gauss at [phone]. ACTIVITY You may resume your regular activity, but move at a slower pace for the next 24 hours.  Take frequent rest periods for the next 24 hours.  Walking will help get rid of the air and reduce the bloated feeling in your belly (abdomen).  No driving for 24 hours (because of the medicine (anesthesia) used during the test).   Do not sign any important legal documents or operate any machinery for 24 hours (because of the anesthesia used during the test).  NUTRITION Drink plenty of fluids.  You may resume your normal diet as instructed by your doctor.  Begin with a light meal and progress to your normal diet. Heavy or fried foods are harder to digest and may make you feel sick to your stomach (nauseated).  Avoid alcoholic beverages for 24 hours or as instructed.  MEDICATIONS You may resume your normal medications unless your doctor tells you otherwise.  WHAT YOU CAN EXPECT TODAY Some feelings of bloating in the abdomen.  Passage of more gas than usual.  Spotting of blood in your stool or on the toilet paper.  IF YOU HAD POLYPS REMOVED DURING THE COLONOSCOPY: No aspirin products for 7 days or as instructed.  No alcohol for 7 days or as instructed.  Eat a soft diet for the next 24 hours.  FINDING OUT THE RESULTS OF YOUR TEST Not all test results are available during your visit. If your test results are not back during the visit, make an appointment with your caregiver to find out the  results. Do not assume everything is normal if you have not heard from your caregiver or the medical facility. It is important for you to follow up on all of your test results.  SEEK IMMEDIATE MEDICAL ATTENTION IF: You have more than a spotting of blood in your stool.  Your belly is swollen (abdominal distention).  You are nauseated or vomiting.  You have a temperature over 101.  You have abdominal pain or discomfort that is severe or gets worse throughout the day.      2 polyps found and removed from your colon.  You also have diverticulosis  Information on colon polyps and diverticulosis.  Further recommendations to follow pending review of pathology report  At patient request, called Claris Che at [phone] rolled to voicemail "voicemail not set up yet"

## 2023-05-07 NOTE — Op Note (Signed)
[facility] Patient Name: Margaret [name] Procedure Date: 05/07/2023 8:03 AM MRN: [id number] Date of Birth: 20-May-1971 Attending MD: Gennette Pac , MD, [id number] CSN: [id number] Age: 52 Admit Type: Outpatient Procedure:                Colonoscopy Indications:              Screening for colorectal malignant neoplasm Providers:                Gennette Pac, MD, Edrick Kins, RN,                            Zena Amos Referring MD:              Medicines:                Propofol per Anesthesia Complications:            No immediate complications. Estimated Blood Loss:     Estimated blood loss: none. Procedure:                Pre-Anesthesia Assessment:                           - Prior to the procedure, a History and Physical                            was performed, and patient medications and                            allergies were reviewed. The patient's tolerance of                            previous anesthesia was also reviewed. The risks                            and benefits of the procedure and the sedation                            options and risks were discussed with the patient.                            All questions were answered, and informed consent                            was obtained. Prior Anticoagulants: The patient has                            taken no anticoagulant or antiplatelet agents. ASA                            Grade Assessment: III - A patient with severe                            systemic disease. After reviewing the risks and  benefits, the patient was deemed in satisfactory                            condition to undergo the procedure.                           After obtaining informed consent, the colonoscope                            was passed under direct vision. Throughout the                            procedure, the patient's blood pressure, pulse, and                            oxygen  saturations were monitored continuously. The                            [id number]) scope was introduced through the                            anus and advanced to the the cecum, identified by                            appendiceal orifice and ileocecal valve. The                            colonoscopy was performed without difficulty. The                            patient tolerated the procedure well. The quality                            of the bowel preparation was adequate. The                            ileocecal valve, appendiceal orifice, and rectum                            were photographed. Scope In: 8:42:59 AM Scope Out: 8:57:39 AM Scope Withdrawal Time: 0 hours 6 minutes 45 seconds  Total Procedure Duration: 0 hours 14 minutes 40 seconds  Findings:      The perianal and digital rectal examinations were normal.      Scattered medium-mouthed diverticula were found in the entire colon.      Two semi-pedunculated polyps were found in the recto-sigmoid colon and       hepatic flexure. The polyps were 4 to 7 mm in size. These polyps were       removed with a cold snare. Resection and retrieval were complete.       Estimated blood loss was minimal.      The exam was otherwise without abnormality on direct and retroflexion       views. Impression:               - Diverticulosis in the entire examined colon.                           -  Two 4 to 7 mm polyps at the recto-sigmoid colon                            and at the hepatic flexure, removed with a cold                            snare. Resected and retrieved.                           - The examination was otherwise normal on direct                            and retroflexion views. Moderate Sedation:      Moderate (conscious) sedation was personally administered by an       anesthesia professional. The following parameters were monitored: oxygen       saturation, heart rate, blood pressure, respiratory rate, EKG,  adequacy       of pulmonary ventilation, and response to care. Recommendation:           - Patient has a contact number available for                            emergencies. The signs and symptoms of potential                            delayed complications were discussed with the                            patient. Return to normal activities tomorrow.                            Written discharge instructions were provided to the                            patient.                           - Resume previous diet.                           - Repeat colonoscopy date to be determined after                            pending pathology results are reviewed. Procedure Code(s):        --- Professional ---                           [phone], Colonoscopy, flexible; with removal of                            tumor(s), polyp(s), or other lesion(s) by snare                            technique Diagnosis Code(s):        --- Professional ---  Z12.11, Encounter for screening for malignant                            neoplasm of colon                           D12.7, Benign neoplasm of rectosigmoid junction                           D12.3, Benign neoplasm of transverse colon (hepatic                            flexure or splenic flexure)                           K57.30, Diverticulosis of large intestine without                            perforation or abscess without bleeding CPT copyright 2022 [employer]. All rights reserved. The codes documented in this report are preliminary and upon coder review may  be revised to meet current compliance requirements. Gerrit Friends. Finn Altemose, MD Gennette Pac, MD 05/07/2023 9:11:11 AM This report has been signed electronically. Number of Addenda: 0

## 2023-05-07 NOTE — Transfer of Care (Signed)
Immediate Anesthesia Transfer of Care Note  Patient: Margaret [name]  Procedure(s) Performed: COLONOSCOPY WITH PROPOFOL POLYPECTOMY  Patient Location: PACU  Anesthesia Type:General  Level of Consciousness: awake and patient cooperative  Airway & Oxygen Therapy: Patient Spontanous Breathing  Post-op Assessment: Report given to RN and Post -op Vital signs reviewed and stable  Post vital signs: Reviewed and stable  Last Vitals:  Vitals Value Taken Time  BP 96/57 05/07/23 0902  Temp 36.4 C 05/07/23 0902  Pulse 87 05/07/23 0902  Resp 14 05/07/23 0902  SpO2 97 % 05/07/23 0902    Last Pain:  Vitals:   05/07/23 0902  TempSrc: Oral  PainSc: 0-No pain      Patients Stated Pain Goal: 4 (05/07/23 0708)  Complications: No notable events documented.

## 2023-05-07 NOTE — H&P (Signed)
@LOGO @   Primary Care Physician:  Richardean Chimera, MD Primary Gastroenterologist:  Dr. Jena Gauss  Pre-Procedure History & Physical: HPI:  Margaret [name] is a 52 y.o. female here for Average risk sreening colonoscopy.  Hyperplastic polyp removed 14 years ago.  No family history of colon cancer.  Father had polyps and advanced age.  Past Medical History:  Diagnosis Date   Anxiety 03/07/2014   Boil 11/23/2015   Complication of anesthesia    Severe itching after spinal (C-Section)   Dyslipidemia 05/21/2015   Elevated blood sugar 05/21/2015   Hemorrhoids    Hot flashes 02/15/2015   Obesity    Peri-menopause 02/15/2015   Skin cancer 01/2017   left leg    Past Surgical History:  Procedure Laterality Date   ANKLE FRACTURE SURGERY     CESAREAN SECTION     COLONOSCOPY  2014   Dr. Gwenevere Abbot;  [facility]; normal exam, hemorrhoids per patient.   ENDOMETRIAL ABLATION  2012   ORIF ANKLE FRACTURE  03/11/2012   Procedure: OPEN REDUCTION INTERNAL FIXATION (ORIF) ANKLE FRACTURE;  Surgeon: Vickki Hearing, MD;  Location: [facility];  Service: Orthopedics;  Laterality: Left;   Skin cancer removed      Prior to Admission medications   Medication Sig Start Date End Date Taking? Authorizing Provider  ALPRAZolam Prudy Feeler) 0.5 MG tablet TAKE 1 TABLET BY MOUTH EVERY TWELVE HOURS AS NEEDED FOR ANXIETY 03/25/23  Yes Cyril Mourning A, NP  cholecalciferol (VITAMIN D3) 25 MCG (1000 UNIT) tablet Take 1,000 Units by mouth daily.   Yes [provider]  escitalopram (LEXAPRO) 10 MG tablet TAKE 1 TABLET BY MOUTH DAILY 05/05/23  Yes Cyril Mourning A, NP  Ibuprofen 200 MG CAPS Take 400 mg by mouth every 6 (six) hours as needed (pain). 01/04/23  Yes [provider]  Multiple Vitamin (MULTIVITAMIN) tablet Take 1 tablet by mouth daily.   Yes [provider]  Sod Picosulfate-Mag Ox-Cit Acd (CLENPIQ) 10-3.5-12 MG-GM -GM/175ML SOLN Take 1 kit by mouth as directed. 04/06/23  Yes Cayetano Mikita, Gerrit Friends, MD     Allergies as of 04/06/2023   (No Known Allergies)    Family History  Problem Relation Age of Onset   Cancer Paternal Grandfather    Breast cancer Paternal Grandmother    Cancer Maternal Grandmother        breast   Heart disease Maternal Grandmother    Breast cancer Maternal Grandmother    Cancer Maternal Grandfather        bone   Heart disease Father    Diabetes Father    Kidney disease Father    Colon polyps Father    Hyperlipidemia Mother    Cancer Maternal Aunt 38       breast    Breast cancer Maternal Aunt    Other Maternal Aunt        brain tumor   Breast cancer Maternal Aunt    Anesthesia problems Neg Hx    Hypotension Neg Hx    Malignant hyperthermia Neg Hx    Pseudochol deficiency Neg Hx    Colon cancer Neg Hx     Social History   Socioeconomic History   Marital status: Married    Spouse name: Not on file   Number of children: Not on file   Years of education: Not on file   Highest education level: Not on file  Occupational History   Not on file  Tobacco Use   Smoking status: Former  Current packs/day: 0.00    Average packs/day: 0.5 packs/day for 22.0 years (11.0 ttl pk-yrs)    Types: Cigarettes    Start date: 12/21/1989    Quit date: 12/22/2011    Years since quitting: 11.3   Smokeless tobacco: Never   Tobacco comments:    pt uses nicotine vap.   Vaping Use   Vaping status: Some Days  Substance and Sexual Activity   Alcohol use: Yes    Alcohol/week: 3.0 standard drinks of alcohol    Types: 3 Glasses of wine per week    Comment: weekends   Drug use: Never   Sexual activity: Yes    Birth control/protection: Surgical    Comment: ablation  Other Topics Concern   Not on file  Social History Narrative   Not on file   Social Determinants of Health   Financial Resource Strain: Low Risk  (07/14/2022)   Overall Financial Resource Strain (CARDIA)    Difficulty of Paying Living Expenses: Not hard at all  Food Insecurity: No Food Insecurity  (07/14/2022)   Hunger Vital Sign    Worried About Running Out of Food in the Last Year: Never true    Ran Out of Food in the Last Year: Never true  Transportation Needs: No Transportation Needs (07/14/2022)   PRAPARE - Administrator, Civil Service (Medical): No    Lack of Transportation (Non-Medical): No  Physical Activity: Insufficiently Active (07/14/2022)   Exercise Vital Sign    Days of Exercise per Week: 2 days    Minutes of Exercise per Session: 30 min  Stress: No Stress Concern Present (07/14/2022)   [employer] of Occupational Health - Occupational Stress Questionnaire    Feeling of Stress : Only a little  Social Connections: Moderately Integrated (07/14/2022)   Social Connection and Isolation Panel [NHANES]    Frequency of Communication with Friends and Family: More than three times a week    Frequency of Social Gatherings with Friends and Family: Once a week    Attends Religious Services: 1 to 4 times per year    Active Member of [employer] or Organizations: No    Attends Banker Meetings: Never    Marital Status: Married  Catering manager Violence: Not At Risk (07/14/2022)   Humiliation, Afraid, Rape, and Kick questionnaire    Fear of Current or Ex-Partner: No    Emotionally Abused: No    Physically Abused: No    Sexually Abused: No    Review of Systems: See HPI, otherwise negative ROS  Physical Exam: BP 119/72   Pulse 75   Temp 98.6 F (37 C) (Oral)   Resp 15   Ht 5\' 4"  (1.626 m)   Wt 104.3 kg   SpO2 99%   BMI 39.48 kg/m  General:   Alert,  Well-developed, well-nourished, pleasant and cooperative in NAD Mouth:  No deformity or lesions. Neck:  Supple; no masses or thyromegaly. No significant cervical adenopathy. Lungs:  Clear throughout to auscultation.   No wheezes, crackles, or rhonchi. No acute distress. Heart:  Regular rate and rhythm; no murmurs, clicks, rubs,  or gallops. Abdomen: Non-distended, normal bowel sounds.  Soft and  nontender without appreciable mass or hepatosplenomegaly.  Pulses:  Normal pulses noted. Extremities:  Without clubbing or edema.  Impression/Plan:    52 year old lady here for average risk screening colonoscopy. The risks, benefits, limitations, alternatives and imponderables have been reviewed with the patient. Questions have been answered. All parties are agreeable.  Notice: This dictation was prepared with Dragon dictation along with smaller phrase technology. Any transcriptional errors that result from this process are unintentional and may not be corrected upon review.

## 2023-05-07 NOTE — Anesthesia Preprocedure Evaluation (Signed)
Anesthesia Evaluation  Patient identified by MRN, date of birth, ID band Patient awake    Reviewed: Allergy & Precautions, H&P , NPO status , Patient's Chart, lab work & pertinent test results, reviewed documented beta blocker date and time   History of Anesthesia Complications (+) history of anesthetic complications  Airway Mallampati: II  TM Distance: >3 FB Neck ROM: full    Dental no notable dental hx.    Pulmonary neg pulmonary ROS, former smoker   Pulmonary exam normal breath sounds clear to auscultation       Cardiovascular Exercise Tolerance: Good negative cardio ROS  Rhythm:regular Rate:Normal     Neuro/Psych   Anxiety     negative neurological ROS  negative psych ROS   GI/Hepatic negative GI ROS, Neg liver ROS,,,  Endo/Other  negative endocrine ROS    Renal/GU negative Renal ROS  negative genitourinary   Musculoskeletal   Abdominal   Peds  Hematology negative hematology ROS (+)   Anesthesia Other Findings   Reproductive/Obstetrics negative OB ROS                             Anesthesia Physical Anesthesia Plan  ASA: 2  Anesthesia Plan: General   Post-op Pain Management:    Induction:   PONV Risk Score and Plan: Propofol infusion  Airway Management Planned:   Additional Equipment:   Intra-op Plan:   Post-operative Plan:   Informed Consent: I have reviewed the patients History and Physical, chart, labs and discussed the procedure including the risks, benefits and alternatives for the proposed anesthesia with the patient or authorized representative who has indicated his/her understanding and acceptance.     Dental Advisory Given  Plan Discussed with: CRNA  Anesthesia Plan Comments:        Anesthesia Quick Evaluation

## 2023-05-11 ENCOUNTER — Encounter: Payer: Self-pay | Admitting: Internal Medicine

## 2023-05-12 NOTE — Anesthesia Postprocedure Evaluation (Signed)
Anesthesia Post Note  Patient: Margaret [name]  Procedure(s) Performed: COLONOSCOPY WITH PROPOFOL POLYPECTOMY  Patient location during evaluation: Phase II Anesthesia Type: General Level of consciousness: awake Pain management: pain level controlled Vital Signs Assessment: post-procedure vital signs reviewed and stable Respiratory status: spontaneous breathing and respiratory function stable Cardiovascular status: blood pressure returned to baseline and stable Postop Assessment: no headache and no apparent nausea or vomiting Anesthetic complications: no Comments: Late entry   No notable events documented.   Last Vitals:  Vitals:   05/07/23 0708 05/07/23 0902  BP: 119/72 (!) 96/57  Pulse: 75 87  Resp: 15 14  Temp: 37 C 36.4 C  SpO2: 99% 97%    Last Pain:  Vitals:   05/07/23 0902  TempSrc: Oral  PainSc: 0-No pain                 Windell Norfolk

## 2023-05-13 ENCOUNTER — Encounter (HOSPITAL_COMMUNITY): Payer: Self-pay | Admitting: Internal Medicine

## 2023-05-21 ENCOUNTER — Ambulatory Visit: Payer: BC Managed Care – PPO

## 2023-05-25 ENCOUNTER — Ambulatory Visit
Admission: RE | Admit: 2023-05-25 | Discharge: 2023-05-25 | Disposition: A | Discharge status: Home / Self Care | Payer: BC Managed Care – PPO | Source: Ambulatory Visit | Attending: Obstetrics & Gynecology | Admitting: Obstetrics & Gynecology

## 2023-05-25 DIAGNOSIS — Z1231 Encounter for screening mammogram for malignant neoplasm of breast: Secondary | ICD-10-CM

## 2023-06-11 ENCOUNTER — Other Ambulatory Visit: Payer: Self-pay | Admitting: Adult Health

## 2023-06-19 ENCOUNTER — Ambulatory Visit: Payer: BC Managed Care – PPO | Admitting: Orthopedic Surgery

## 2023-06-19 ENCOUNTER — Encounter: Payer: Self-pay | Admitting: Orthopedic Surgery

## 2023-06-19 ENCOUNTER — Other Ambulatory Visit (INDEPENDENT_AMBULATORY_CARE_PROVIDER_SITE_OTHER): Payer: Self-pay

## 2023-06-19 VITALS — BP 133/87 | HR 108 | Ht 64.0 in | Wt 235.0 lb

## 2023-06-19 DIAGNOSIS — M25561 Pain in right knee: Secondary | ICD-10-CM

## 2023-06-19 DIAGNOSIS — M23303 Other meniscus derangements, unspecified medial meniscus, right knee: Secondary | ICD-10-CM

## 2023-06-19 NOTE — Progress Notes (Signed)
Chief Complaint  Patient presents with   Knee Pain    Right/ walking more at work has had pain worse in August a little better since was able to rest for labor day     This is a 52 year old female comes in with new complaints of right knee pain which started in August of this year without any associated trauma  The patient's knee began to have pain and swelling especially going down the steps and she developed a limp.  She had significant difficulty ambulating at times often having to hold onto the wall to walk.  After she got going it seemed to improve  She started doing the ice elevation and activity modification and now the knee feels better with just some medial pain  The pain in August was more on the lateral side and it was nonradiating near the lateral joint line but a little bit more posterior  Problem list, medical hx, medications and allergies reviewed   No Known Allergies  BP 133/87   Pulse (!) 108   Ht 5\' 4"  (1.626 m)   Wt 235 lb (106.6 kg)   BMI 40.34 kg/m   Physical Exam Vitals and nursing note reviewed.  Constitutional:      Appearance: Normal appearance.  HENT:     Head: Normocephalic and atraumatic.  Eyes:     General: No scleral icterus.       Right eye: No discharge.        Left eye: No discharge.     Extraocular Movements: Extraocular movements intact.     Conjunctiva/sclera: Conjunctivae normal.     Pupils: Pupils are equal, round, and reactive to light.  Cardiovascular:     Rate and Rhythm: Normal rate.     Pulses: Normal pulses.  Skin:    General: Skin is warm and dry.     Capillary Refill: Capillary refill takes less than 2 seconds.  Neurological:     General: No focal deficit present.     Mental Status: She is alert and oriented to person, place, and time.  Psychiatric:        Mood and Affect: Mood normal.        Behavior: Behavior normal.        Thought Content: Thought content normal.        Judgment: Judgment normal.    Examination of  the right knee there may be a small effusion most of the tenderness is actually on the medial joint line the lateral side is nontender the anterior compartment of the lower leg is nontender  She had some pain with the meniscus maneuvers such as McMurray's and Apley's test  Cruciate ligaments were intact collateral ligaments stable  Radiographs were obtained  See dictated report   mild arthritis  Assessment and plan  Encounter Diagnoses  Name Primary?   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

## 2023-06-19 NOTE — Patient Instructions (Signed)
Continue ice and ibuprofen as needed   Strengthen the quadriceps muscle with exercises and   Avoid step when possible

## 2023-07-31 ENCOUNTER — Encounter: Payer: Self-pay | Admitting: Orthopedic Surgery

## 2023-07-31 ENCOUNTER — Ambulatory Visit: Payer: BC Managed Care – PPO | Admitting: Orthopedic Surgery

## 2023-07-31 VITALS — Ht 64.0 in | Wt 235.0 lb

## 2023-07-31 DIAGNOSIS — M76829 Posterior tibial tendinitis, unspecified leg: Secondary | ICD-10-CM

## 2023-07-31 DIAGNOSIS — M25561 Pain in right knee: Secondary | ICD-10-CM | POA: Diagnosis not present

## 2023-07-31 NOTE — Progress Notes (Signed)
Chief Complaint  Patient presents with   Knee Pain    Right/ feels better    52 year old female was having some issues with the right knee comes in today saying she is having a little twinge of pain on the medial side of the joint  Her exam is unremarkable as she has full range of motion no tenderness or swelling she has good quadriceps function and normal anterior posterior drawer  She is concerned about some soreness she has been having over the medial side of her left ankle  She had ORIF of the left ankle years ago in 2013  Exam shows that she has a slight pes planus which is flexible she has some tenderness on the posterior aspect of the medial malleolus consistent with a posterior tibial tendon the surgical site is in good condition with good healing no erythema and no tenderness over the bone  Right knee pain improved  Left ankle mild posterior tibial tendinitis  Encounter Diagnoses  Name Primary?   Acute pain of right knee Yes   PTTD (posterior tibial tendon dysfunction)    Recommend topical menthol/Bengay type medication for the ankle  Follow-up if any problems on the knee or ankle

## 2023-08-03 ENCOUNTER — Other Ambulatory Visit: Payer: Self-pay | Admitting: Adult Health

## 2023-09-07 ENCOUNTER — Other Ambulatory Visit: Payer: Self-pay | Admitting: Adult Health

## 2023-10-21 ENCOUNTER — Other Ambulatory Visit: Payer: Self-pay | Admitting: Adult Health

## 2023-10-26 ENCOUNTER — Ambulatory Visit: Payer: 59 | Admitting: Adult Health

## 2023-10-26 ENCOUNTER — Encounter: Payer: Self-pay | Admitting: Adult Health

## 2023-10-26 VITALS — BP 137/86 | HR 86 | Ht 64.0 in | Wt 228.0 lb

## 2023-10-26 DIAGNOSIS — F419 Anxiety disorder, unspecified: Secondary | ICD-10-CM

## 2023-10-26 DIAGNOSIS — Z1331 Encounter for screening for depression: Secondary | ICD-10-CM | POA: Diagnosis not present

## 2023-10-26 DIAGNOSIS — Z01419 Encounter for gynecological examination (general) (routine) without abnormal findings: Secondary | ICD-10-CM | POA: Diagnosis not present

## 2023-10-26 DIAGNOSIS — Z1211 Encounter for screening for malignant neoplasm of colon: Secondary | ICD-10-CM

## 2023-10-26 LAB — HEMOCCULT GUIAC POC 1CARD (OFFICE): Fecal Occult Blood, POC: NEGATIVE

## 2023-10-26 NOTE — Progress Notes (Signed)
 Patient ID: Margaret [name], female   DOB: Apr 16, 1971, 53 y.o.   MRN: [id number] History of Present Illness: Margaret [name] is a 53 year old white female, married, PM in for a well woman gyn exam. 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 She is on ozempic, A1c was 6.6 when seen by PCP, in October, and has lost about 16 lbs. She is walking 20 minutes at least 3 x weekly now.     Component Value Date/Time   DIAGPAP  07/14/2022 1046    - Negative for intraepithelial lesion or malignancy (NILM)   DIAGPAP  10/28/2019 0853    - Negative for intraepithelial lesion or malignancy (NILM)   HPVHIGH Negative 07/14/2022 1046   HPVHIGH Negative 10/28/2019 0853   ADEQPAP  07/14/2022 1046    Satisfactory for evaluation; transformation zone component ABSENT.   ADEQPAP  10/28/2019 0853    Satisfactory for evaluation; transformation zone component ABSENT.    PCP is Dr Toribio.   Current Medications, Allergies, Past Medical History, Past Surgical History, Family History and Social History were reviewed in [employer] record.     Review of Systems:     Physical Exam:BP 137/86 (BP Location: Left Arm, Patient Position: Sitting, Cuff Size: Normal)   Pulse 86   Ht 5' 4 (1.626 m)   Wt 228 lb (103.4 kg)   BMI 39.14 kg/m   General:  Well developed, well nourished, no acute distress Skin:  Warm and dry Neck:  Midline trachea, normal thyroid, good ROM, no lymphadenopathy,no carotid bruits heard Lungs; Clear to auscultation bilaterally Breast:  No dominant palpable mass, retraction, or nipple discharge Cardiovascular: Regular rate and rhythm Abdomen:  Soft, non tender, no hepatosplenomegaly Pelvic:  External genitalia is normal in appearance, no lesions.  The vagina is normal in appearance. Urethra has no lesions or masses. The cervix is smooth.  Uterus is felt to be normal size, shape, and contour.  No adnexal masses or tenderness noted.Bladder is non tender, no masses felt. Rectal:  Good sphincter tone, no polyps, or hemorrhoids felt.  Hemoccult negative. Extremities/musculoskeletal:  No swelling or varicosities noted, no clubbing or cyanosis Psych:  No mood changes, alert and cooperative,seems happy She brought me a copy of her labs, reviewed together and will scan in.  AA is 3 Fall risk is moderate    10/26/2023   10:06 AM 07/14/2022   10:43 AM 04/16/2021    2:53 PM  Depression screen PHQ 2/9  Decreased Interest 0 0 0  Down, Depressed, Hopeless 0 0 0  PHQ - 2 Score 0 0 0  Altered sleeping 0 0 0  Tired, decreased energy 1 1 1   Change in appetite 0 1 1  Feeling bad or failure about yourself  0 0 0  Trouble concentrating 0 0 0  Moving slowly or fidgety/restless 0 0 0  Suicidal thoughts 0 0 0  PHQ-9 Score 1 2 2        10/26/2023   10:06 AM 07/14/2022   10:43 AM 04/16/2021    2:54 PM  GAD 7 : Generalized Anxiety Score  Nervous, Anxious, on Edge 1 0 1  Control/stop worrying 0 0 0  Worry too much - different things 1 1 1   Trouble relaxing 0 0 0  Restless 0 0 0  Easily annoyed or irritable 0 0 0  Afraid - awful might happen 0 0 0  Total GAD 7 Score 2 1 2     Upstream - 10/26/23 1005  Pregnancy Intention Screening   Does the patient want to become pregnant in the next year? N/A    Does the patient's partner want to become pregnant in the next year? N/A    Would the patient like to discuss contraceptive options today? N/A      Contraception Wrap Up   Current Method No Method - Other Reason   ablation, PM   Reason for No Current Contraceptive Method at Intake ([facility] Only) Other    End Method No Method - Other Reason   ablation, PM   Contraception Counseling Provided No            Examination chaperoned by Clarita Salt LPN    Impression and plan: 1. Encounter for well woman exam with routine gynecological exam (Primary) Physical in 1 year Labs with PCP Mammogram was negative 05/25/23 Had colonoscopy  05/07/23 Stay active   2. Encounter for  screening fecal occult blood testing Hemoccult was negative  - POCT occult blood stool  3. Anxiety Doing well, is  taking lexapro  and has xanax  prn, has refills

## 2023-12-03 ENCOUNTER — Other Ambulatory Visit: Payer: Self-pay | Admitting: Adult Health

## 2024-01-13 ENCOUNTER — Other Ambulatory Visit: Payer: Self-pay | Admitting: Adult Health

## 2024-03-02 ENCOUNTER — Other Ambulatory Visit: Payer: Self-pay | Admitting: Adult Health

## 2024-04-11 ENCOUNTER — Other Ambulatory Visit: Payer: Self-pay | Admitting: Obstetrics & Gynecology

## 2024-04-11 DIAGNOSIS — Z1231 Encounter for screening mammogram for malignant neoplasm of breast: Secondary | ICD-10-CM

## 2024-04-25 ENCOUNTER — Other Ambulatory Visit: Payer: Self-pay | Admitting: Adult Health

## 2024-05-30 ENCOUNTER — Ambulatory Visit
Admission: RE | Admit: 2024-05-30 | Discharge: 2024-05-30 | Disposition: A | Discharge status: Home / Self Care | Source: Ambulatory Visit | Attending: Obstetrics & Gynecology | Admitting: Obstetrics & Gynecology

## 2024-05-30 DIAGNOSIS — Z1231 Encounter for screening mammogram for malignant neoplasm of breast: Secondary | ICD-10-CM

## 2024-06-01 ENCOUNTER — Other Ambulatory Visit: Payer: Self-pay | Admitting: Adult Health

## 2024-07-17 ENCOUNTER — Other Ambulatory Visit: Payer: Self-pay | Admitting: Adult Health

## 2024-07-21 ENCOUNTER — Encounter: Payer: Self-pay | Admitting: *Deleted

## 2024-08-30 ENCOUNTER — Other Ambulatory Visit: Payer: Self-pay | Admitting: Adult Health

## 2024-11-01 ENCOUNTER — Other Ambulatory Visit: Payer: Self-pay | Admitting: Adult Health

## 2024-11-17 ENCOUNTER — Encounter: Payer: Self-pay | Admitting: Adult Health

## 2024-11-17 ENCOUNTER — Ambulatory Visit: Admitting: Adult Health

## 2024-11-17 VITALS — BP 117/77 | HR 88 | Ht 64.0 in | Wt 214.0 lb

## 2024-11-17 DIAGNOSIS — Z01419 Encounter for gynecological examination (general) (routine) without abnormal findings: Secondary | ICD-10-CM

## 2024-11-17 DIAGNOSIS — F419 Anxiety disorder, unspecified: Secondary | ICD-10-CM

## 2024-11-17 DIAGNOSIS — Z1151 Encounter for screening for human papillomavirus (HPV): Secondary | ICD-10-CM

## 2024-11-17 NOTE — Progress Notes (Signed)
 Patient ID: Margaret [name], female   DOB: 12-02-1970, 54 y.o.   MRN: [id number] History of Present Illness: Margaret [name] is a 54 year old white female, married, PM in for a well woman gyn exam and pap. She is retired but working some and has a new grandbaby [name], born 11/15/24.  She has lost 25 lbs on Ozempic since 2024 and weaned off lexapro .  PCP is Dr Toribio   Current Medications, Allergies, Past Medical History, Past Surgical History, Family History and Social History were reviewed in [employer] record.     Review of Systems: Patient denies any headaches, hearing loss, fatigue, blurred vision, shortness of breath, chest pain, abdominal pain, problems with bowel movements, urination, or intercourse. No joint pain or mood swings.  Denies any vaginal bleeding    Physical Exam:BP 117/77 (BP Location: Left Arm, Patient Position: Sitting, Cuff Size: Large)   Pulse 88   Ht 5' 4 (1.626 m)   Wt 214 lb (97.1 kg)   BMI 36.73 kg/m   General:  Well developed, well nourished, no acute distress Skin:  Warm and dry Neck:  Midline trachea, normal thyroid, good ROM, no lymphadenopathy Lungs; Clear to auscultation bilaterally Breast:  No dominant palpable mass, retraction, or nipple discharge Cardiovascular: Regular rate and rhythm Abdomen:  Soft, non tender, no hepatosplenomegaly Pelvic:  External genitalia is normal in appearance, no lesions.  The vagina is normal in appearance. Urethra has no lesions or masses. The cervix is smooth, pap with HR HPV genotyping performed.  Uterus is felt to be normal size, shape, and contour.  No adnexal masses or tenderness noted.Bladder is non tender, no masses felt. Rectal: Deferred Extremities/musculoskeletal:  No swelling, spider veins noted, no clubbing or cyanosis Psych:  No mood changes, alert and cooperative,seems happy AA is 2 Fall risk is low    11/17/2024    2:41 PM 10/26/2023   10:06 AM 07/14/2022   10:43 AM  Depression screen PHQ 2/9   Decreased Interest 0 0 0  Down, Depressed, Hopeless 0 0 0  PHQ - 2 Score 0 0 0  Altered sleeping 0 0 0  Tired, decreased energy 0 1 1  Change in appetite 0 0 1  Feeling bad or failure about yourself  0 0 0  Trouble concentrating 0 0 0  Moving slowly or fidgety/restless 0 0 0  Suicidal thoughts 0 0 0  PHQ-9 Score 0 1  2      Data saved with a previous flowsheet row definition       11/17/2024    2:41 PM 10/26/2023   10:06 AM 07/14/2022   10:43 AM 04/16/2021    2:54 PM  GAD 7 : Generalized Anxiety Score  Nervous, Anxious, on Edge 1 1  0  1   Control/stop worrying 0 0  0  0   Worry too much - different things 0 1  1  1    Trouble relaxing 0 0  0  0   Restless 0 0  0  0   Easily annoyed or irritable 0 0  0  0   Afraid - awful might happen 0 0  0  0   Total GAD 7 Score 1 2 1 2      Data saved with a previous flowsheet row definition    Upstream - 11/17/24 1438       Pregnancy Intention Screening   Does the patient want to become pregnant in the next year? N/A  Does the patient's partner want to become pregnant in the next year? N/A    Would the patient like to discuss contraceptive options today? N/A      Contraception Wrap Up   Current Method Post-Menopause   ablation   End Method Post-Menopause   ablation   Contraception Counseling Provided No           Examination chaperoned by Clarita Salt LPN   Impression and plan:  1. Encounter for gynecological examination with Papanicolaou smear of cervix (Primary) Pap sent Pap in 3 years if negative Physical in 1 year Labs with PCP Mammogram was negative 05/30/24 Colonoscopy per GI  - Cytology - PAP( [place])  2. Anxiety Has xanax  prn
